# Patient Record
Sex: Female | Born: 1935
Health system: Southern US, Community
[De-identification: ages and names within clinical notes are randomized; demographics above are authoritative.]

## PROBLEM LIST (undated history)

## (undated) DIAGNOSIS — T7840XA Allergy, unspecified, initial encounter: Secondary | ICD-10-CM

## (undated) DIAGNOSIS — F32A Depression, unspecified: Secondary | ICD-10-CM

## (undated) DIAGNOSIS — F329 Major depressive disorder, single episode, unspecified: Secondary | ICD-10-CM

## (undated) DIAGNOSIS — M1990 Unspecified osteoarthritis, unspecified site: Secondary | ICD-10-CM

## (undated) DIAGNOSIS — C73 Malignant neoplasm of thyroid gland: Secondary | ICD-10-CM

## (undated) DIAGNOSIS — I10 Essential (primary) hypertension: Secondary | ICD-10-CM

## (undated) DIAGNOSIS — F419 Anxiety disorder, unspecified: Secondary | ICD-10-CM

## (undated) DIAGNOSIS — I482 Chronic atrial fibrillation, unspecified: Secondary | ICD-10-CM

## (undated) DIAGNOSIS — U071 COVID-19: Secondary | ICD-10-CM

## (undated) HISTORY — DX: Malignant neoplasm of thyroid gland: C73

## (undated) HISTORY — DX: Major depressive disorder, single episode, unspecified: F32.9

## (undated) HISTORY — PX: TONSILLECTOMY AND ADENOIDECTOMY: SUR1326

## (undated) HISTORY — DX: Allergy, unspecified, initial encounter: T78.40XA

## (undated) HISTORY — PX: THYROIDECTOMY: SHX17

## (undated) HISTORY — DX: Chronic atrial fibrillation, unspecified: I48.20

## (undated) HISTORY — DX: Unspecified osteoarthritis, unspecified site: M19.90

## (undated) HISTORY — DX: Depression, unspecified: F32.A

## (undated) HISTORY — DX: Essential (primary) hypertension: I10

## (undated) HISTORY — DX: Anxiety disorder, unspecified: F41.9

## (undated) HISTORY — PX: APPENDECTOMY: SHX54

---

## 2005-03-27 DIAGNOSIS — J301 Allergic rhinitis due to pollen: Secondary | ICD-10-CM | POA: Insufficient documentation

## 2008-12-21 DIAGNOSIS — M109 Gout, unspecified: Secondary | ICD-10-CM | POA: Insufficient documentation

## 2012-06-24 DIAGNOSIS — E559 Vitamin D deficiency, unspecified: Secondary | ICD-10-CM | POA: Insufficient documentation

## 2014-04-03 DIAGNOSIS — M810 Age-related osteoporosis without current pathological fracture: Secondary | ICD-10-CM | POA: Insufficient documentation

## 2014-08-29 DIAGNOSIS — I10 Essential (primary) hypertension: Secondary | ICD-10-CM | POA: Insufficient documentation

## 2015-02-27 DIAGNOSIS — E782 Mixed hyperlipidemia: Secondary | ICD-10-CM | POA: Insufficient documentation

## 2015-08-30 DIAGNOSIS — I480 Paroxysmal atrial fibrillation: Secondary | ICD-10-CM | POA: Insufficient documentation

## 2016-09-02 DIAGNOSIS — R911 Solitary pulmonary nodule: Secondary | ICD-10-CM | POA: Insufficient documentation

## 2016-12-31 DIAGNOSIS — J449 Chronic obstructive pulmonary disease, unspecified: Secondary | ICD-10-CM | POA: Diagnosis not present

## 2016-12-31 DIAGNOSIS — R918 Other nonspecific abnormal finding of lung field: Secondary | ICD-10-CM | POA: Diagnosis not present

## 2017-01-02 DIAGNOSIS — E039 Hypothyroidism, unspecified: Secondary | ICD-10-CM | POA: Diagnosis not present

## 2017-01-02 DIAGNOSIS — F411 Generalized anxiety disorder: Secondary | ICD-10-CM | POA: Diagnosis not present

## 2017-01-02 DIAGNOSIS — E785 Hyperlipidemia, unspecified: Secondary | ICD-10-CM | POA: Diagnosis not present

## 2017-01-02 DIAGNOSIS — J410 Simple chronic bronchitis: Secondary | ICD-10-CM | POA: Diagnosis not present

## 2017-01-02 DIAGNOSIS — I10 Essential (primary) hypertension: Secondary | ICD-10-CM | POA: Diagnosis not present

## 2017-01-08 DIAGNOSIS — T887XXA Unspecified adverse effect of drug or medicament, initial encounter: Secondary | ICD-10-CM | POA: Diagnosis not present

## 2017-01-08 DIAGNOSIS — I10 Essential (primary) hypertension: Secondary | ICD-10-CM | POA: Diagnosis not present

## 2017-02-04 DIAGNOSIS — J22 Unspecified acute lower respiratory infection: Secondary | ICD-10-CM | POA: Diagnosis not present

## 2017-03-09 DIAGNOSIS — R05 Cough: Secondary | ICD-10-CM | POA: Diagnosis not present

## 2017-03-09 DIAGNOSIS — I10 Essential (primary) hypertension: Secondary | ICD-10-CM | POA: Diagnosis not present

## 2017-03-09 DIAGNOSIS — J410 Simple chronic bronchitis: Secondary | ICD-10-CM | POA: Diagnosis not present

## 2017-03-09 DIAGNOSIS — F411 Generalized anxiety disorder: Secondary | ICD-10-CM | POA: Diagnosis not present

## 2017-03-09 DIAGNOSIS — N289 Disorder of kidney and ureter, unspecified: Secondary | ICD-10-CM | POA: Diagnosis not present

## 2017-03-09 DIAGNOSIS — E039 Hypothyroidism, unspecified: Secondary | ICD-10-CM | POA: Diagnosis not present

## 2017-03-09 DIAGNOSIS — R739 Hyperglycemia, unspecified: Secondary | ICD-10-CM | POA: Diagnosis not present

## 2017-03-16 DIAGNOSIS — R911 Solitary pulmonary nodule: Secondary | ICD-10-CM | POA: Diagnosis present

## 2017-03-16 DIAGNOSIS — Z7982 Long term (current) use of aspirin: Secondary | ICD-10-CM | POA: Diagnosis not present

## 2017-03-16 DIAGNOSIS — I340 Nonrheumatic mitral (valve) insufficiency: Secondary | ICD-10-CM | POA: Diagnosis not present

## 2017-03-16 DIAGNOSIS — J9811 Atelectasis: Secondary | ICD-10-CM | POA: Diagnosis not present

## 2017-03-16 DIAGNOSIS — I160 Hypertensive urgency: Secondary | ICD-10-CM | POA: Diagnosis not present

## 2017-03-16 DIAGNOSIS — E860 Dehydration: Secondary | ICD-10-CM | POA: Diagnosis not present

## 2017-03-16 DIAGNOSIS — R05 Cough: Secondary | ICD-10-CM | POA: Diagnosis not present

## 2017-03-16 DIAGNOSIS — E785 Hyperlipidemia, unspecified: Secondary | ICD-10-CM | POA: Diagnosis present

## 2017-03-16 DIAGNOSIS — D72829 Elevated white blood cell count, unspecified: Secondary | ICD-10-CM | POA: Diagnosis not present

## 2017-03-16 DIAGNOSIS — E890 Postprocedural hypothyroidism: Secondary | ICD-10-CM | POA: Diagnosis present

## 2017-03-16 DIAGNOSIS — I10 Essential (primary) hypertension: Secondary | ICD-10-CM | POA: Diagnosis not present

## 2017-03-16 DIAGNOSIS — E871 Hypo-osmolality and hyponatremia: Secondary | ICD-10-CM | POA: Diagnosis not present

## 2017-03-16 DIAGNOSIS — M810 Age-related osteoporosis without current pathological fracture: Secondary | ICD-10-CM | POA: Diagnosis present

## 2017-03-16 DIAGNOSIS — I480 Paroxysmal atrial fibrillation: Secondary | ICD-10-CM | POA: Diagnosis present

## 2017-03-16 DIAGNOSIS — I161 Hypertensive emergency: Secondary | ICD-10-CM | POA: Diagnosis not present

## 2017-03-16 DIAGNOSIS — J449 Chronic obstructive pulmonary disease, unspecified: Secondary | ICD-10-CM | POA: Diagnosis present

## 2017-03-16 DIAGNOSIS — I440 Atrioventricular block, first degree: Secondary | ICD-10-CM | POA: Diagnosis not present

## 2017-03-16 DIAGNOSIS — Z85850 Personal history of malignant neoplasm of thyroid: Secondary | ICD-10-CM | POA: Diagnosis not present

## 2017-03-16 DIAGNOSIS — I313 Pericardial effusion (noninflammatory): Secondary | ICD-10-CM | POA: Diagnosis not present

## 2017-03-16 DIAGNOSIS — E119 Type 2 diabetes mellitus without complications: Secondary | ICD-10-CM | POA: Diagnosis not present

## 2017-03-16 DIAGNOSIS — R55 Syncope and collapse: Secondary | ICD-10-CM | POA: Diagnosis not present

## 2017-03-16 DIAGNOSIS — M109 Gout, unspecified: Secondary | ICD-10-CM | POA: Diagnosis present

## 2017-03-23 DIAGNOSIS — I10 Essential (primary) hypertension: Secondary | ICD-10-CM | POA: Diagnosis not present

## 2017-03-23 DIAGNOSIS — J449 Chronic obstructive pulmonary disease, unspecified: Secondary | ICD-10-CM | POA: Diagnosis not present

## 2017-03-23 DIAGNOSIS — E785 Hyperlipidemia, unspecified: Secondary | ICD-10-CM | POA: Diagnosis not present

## 2017-03-23 DIAGNOSIS — I480 Paroxysmal atrial fibrillation: Secondary | ICD-10-CM | POA: Diagnosis not present

## 2017-03-23 DIAGNOSIS — I499 Cardiac arrhythmia, unspecified: Secondary | ICD-10-CM | POA: Diagnosis not present

## 2017-03-23 DIAGNOSIS — R55 Syncope and collapse: Secondary | ICD-10-CM | POA: Diagnosis not present

## 2017-03-23 DIAGNOSIS — Z79899 Other long term (current) drug therapy: Secondary | ICD-10-CM | POA: Diagnosis not present

## 2017-03-23 DIAGNOSIS — I440 Atrioventricular block, first degree: Secondary | ICD-10-CM | POA: Diagnosis not present

## 2017-03-27 DIAGNOSIS — E039 Hypothyroidism, unspecified: Secondary | ICD-10-CM | POA: Diagnosis not present

## 2017-03-27 DIAGNOSIS — I4891 Unspecified atrial fibrillation: Secondary | ICD-10-CM | POA: Diagnosis not present

## 2017-03-27 DIAGNOSIS — S32010A Wedge compression fracture of first lumbar vertebra, initial encounter for closed fracture: Secondary | ICD-10-CM | POA: Diagnosis not present

## 2017-03-27 DIAGNOSIS — I440 Atrioventricular block, first degree: Secondary | ICD-10-CM | POA: Diagnosis not present

## 2017-03-27 DIAGNOSIS — M4856XA Collapsed vertebra, not elsewhere classified, lumbar region, initial encounter for fracture: Secondary | ICD-10-CM | POA: Diagnosis not present

## 2017-03-27 DIAGNOSIS — E781 Pure hyperglyceridemia: Secondary | ICD-10-CM | POA: Diagnosis not present

## 2017-03-27 DIAGNOSIS — M545 Low back pain: Secondary | ICD-10-CM | POA: Diagnosis not present

## 2017-03-27 DIAGNOSIS — W19XXXA Unspecified fall, initial encounter: Secondary | ICD-10-CM | POA: Diagnosis not present

## 2017-03-27 DIAGNOSIS — I10 Essential (primary) hypertension: Secondary | ICD-10-CM | POA: Diagnosis not present

## 2017-03-27 DIAGNOSIS — E890 Postprocedural hypothyroidism: Secondary | ICD-10-CM | POA: Diagnosis not present

## 2017-03-27 DIAGNOSIS — M109 Gout, unspecified: Secondary | ICD-10-CM | POA: Diagnosis not present

## 2017-03-27 DIAGNOSIS — J449 Chronic obstructive pulmonary disease, unspecified: Secondary | ICD-10-CM | POA: Diagnosis not present

## 2017-03-27 DIAGNOSIS — E785 Hyperlipidemia, unspecified: Secondary | ICD-10-CM | POA: Diagnosis not present

## 2017-03-27 DIAGNOSIS — I480 Paroxysmal atrial fibrillation: Secondary | ICD-10-CM | POA: Diagnosis not present

## 2017-03-27 DIAGNOSIS — E119 Type 2 diabetes mellitus without complications: Secondary | ICD-10-CM | POA: Diagnosis not present

## 2017-03-27 DIAGNOSIS — R300 Dysuria: Secondary | ICD-10-CM | POA: Diagnosis not present

## 2017-03-31 DIAGNOSIS — R05 Cough: Secondary | ICD-10-CM | POA: Diagnosis not present

## 2017-03-31 DIAGNOSIS — I10 Essential (primary) hypertension: Secondary | ICD-10-CM | POA: Diagnosis not present

## 2017-03-31 DIAGNOSIS — J449 Chronic obstructive pulmonary disease, unspecified: Secondary | ICD-10-CM | POA: Diagnosis not present

## 2017-04-02 DIAGNOSIS — M4316 Spondylolisthesis, lumbar region: Secondary | ICD-10-CM | POA: Diagnosis not present

## 2017-04-02 DIAGNOSIS — M4856XA Collapsed vertebra, not elsewhere classified, lumbar region, initial encounter for fracture: Secondary | ICD-10-CM | POA: Diagnosis not present

## 2017-04-02 DIAGNOSIS — M5136 Other intervertebral disc degeneration, lumbar region: Secondary | ICD-10-CM | POA: Diagnosis not present

## 2017-04-02 DIAGNOSIS — M1288 Other specific arthropathies, not elsewhere classified, other specified site: Secondary | ICD-10-CM | POA: Diagnosis not present

## 2017-04-02 DIAGNOSIS — S32010A Wedge compression fracture of first lumbar vertebra, initial encounter for closed fracture: Secondary | ICD-10-CM | POA: Diagnosis not present

## 2017-04-02 DIAGNOSIS — M419 Scoliosis, unspecified: Secondary | ICD-10-CM | POA: Diagnosis not present

## 2017-04-09 DIAGNOSIS — M4695 Unspecified inflammatory spondylopathy, thoracolumbar region: Secondary | ICD-10-CM | POA: Diagnosis not present

## 2017-04-09 DIAGNOSIS — M4805 Spinal stenosis, thoracolumbar region: Secondary | ICD-10-CM | POA: Diagnosis not present

## 2017-04-09 DIAGNOSIS — W19XXXA Unspecified fall, initial encounter: Secondary | ICD-10-CM | POA: Diagnosis not present

## 2017-04-09 DIAGNOSIS — M2578 Osteophyte, vertebrae: Secondary | ICD-10-CM | POA: Diagnosis not present

## 2017-04-09 DIAGNOSIS — R600 Localized edema: Secondary | ICD-10-CM | POA: Diagnosis not present

## 2017-04-09 DIAGNOSIS — M48061 Spinal stenosis, lumbar region without neurogenic claudication: Secondary | ICD-10-CM | POA: Diagnosis not present

## 2017-04-09 DIAGNOSIS — S32010D Wedge compression fracture of first lumbar vertebra, subsequent encounter for fracture with routine healing: Secondary | ICD-10-CM | POA: Diagnosis not present

## 2017-04-09 DIAGNOSIS — S32010A Wedge compression fracture of first lumbar vertebra, initial encounter for closed fracture: Secondary | ICD-10-CM | POA: Diagnosis not present

## 2017-04-18 DIAGNOSIS — R55 Syncope and collapse: Secondary | ICD-10-CM | POA: Diagnosis not present

## 2017-04-21 DIAGNOSIS — E039 Hypothyroidism, unspecified: Secondary | ICD-10-CM | POA: Diagnosis not present

## 2017-04-21 DIAGNOSIS — R531 Weakness: Secondary | ICD-10-CM | POA: Diagnosis not present

## 2017-04-21 DIAGNOSIS — E1165 Type 2 diabetes mellitus with hyperglycemia: Secondary | ICD-10-CM | POA: Diagnosis not present

## 2017-04-21 DIAGNOSIS — I499 Cardiac arrhythmia, unspecified: Secondary | ICD-10-CM | POA: Diagnosis not present

## 2017-04-21 DIAGNOSIS — J449 Chronic obstructive pulmonary disease, unspecified: Secondary | ICD-10-CM | POA: Diagnosis not present

## 2017-04-21 DIAGNOSIS — I440 Atrioventricular block, first degree: Secondary | ICD-10-CM | POA: Diagnosis not present

## 2017-04-21 DIAGNOSIS — Z7982 Long term (current) use of aspirin: Secondary | ICD-10-CM | POA: Diagnosis not present

## 2017-04-21 DIAGNOSIS — I4891 Unspecified atrial fibrillation: Secondary | ICD-10-CM | POA: Diagnosis not present

## 2017-04-21 DIAGNOSIS — I10 Essential (primary) hypertension: Secondary | ICD-10-CM | POA: Diagnosis not present

## 2017-04-21 DIAGNOSIS — R5381 Other malaise: Secondary | ICD-10-CM | POA: Diagnosis not present

## 2017-04-30 DIAGNOSIS — I10 Essential (primary) hypertension: Secondary | ICD-10-CM | POA: Diagnosis not present

## 2017-04-30 DIAGNOSIS — F411 Generalized anxiety disorder: Secondary | ICD-10-CM | POA: Diagnosis not present

## 2017-04-30 DIAGNOSIS — E785 Hyperlipidemia, unspecified: Secondary | ICD-10-CM | POA: Diagnosis not present

## 2017-04-30 DIAGNOSIS — J449 Chronic obstructive pulmonary disease, unspecified: Secondary | ICD-10-CM | POA: Diagnosis not present

## 2017-05-12 DIAGNOSIS — I480 Paroxysmal atrial fibrillation: Secondary | ICD-10-CM | POA: Diagnosis not present

## 2017-05-14 DIAGNOSIS — E785 Hyperlipidemia, unspecified: Secondary | ICD-10-CM | POA: Diagnosis not present

## 2017-05-14 DIAGNOSIS — I480 Paroxysmal atrial fibrillation: Secondary | ICD-10-CM | POA: Diagnosis not present

## 2017-05-14 DIAGNOSIS — I10 Essential (primary) hypertension: Secondary | ICD-10-CM | POA: Diagnosis not present

## 2017-05-14 DIAGNOSIS — J449 Chronic obstructive pulmonary disease, unspecified: Secondary | ICD-10-CM | POA: Diagnosis not present

## 2017-05-20 DIAGNOSIS — Z79899 Other long term (current) drug therapy: Secondary | ICD-10-CM | POA: Diagnosis not present

## 2017-07-01 DIAGNOSIS — R918 Other nonspecific abnormal finding of lung field: Secondary | ICD-10-CM | POA: Diagnosis not present

## 2017-07-01 DIAGNOSIS — J449 Chronic obstructive pulmonary disease, unspecified: Secondary | ICD-10-CM | POA: Diagnosis not present

## 2017-07-16 DIAGNOSIS — I480 Paroxysmal atrial fibrillation: Secondary | ICD-10-CM | POA: Diagnosis not present

## 2017-08-17 DIAGNOSIS — J449 Chronic obstructive pulmonary disease, unspecified: Secondary | ICD-10-CM | POA: Diagnosis not present

## 2017-08-17 DIAGNOSIS — I480 Paroxysmal atrial fibrillation: Secondary | ICD-10-CM | POA: Diagnosis not present

## 2017-08-17 DIAGNOSIS — E785 Hyperlipidemia, unspecified: Secondary | ICD-10-CM | POA: Diagnosis not present

## 2017-08-17 DIAGNOSIS — E039 Hypothyroidism, unspecified: Secondary | ICD-10-CM | POA: Diagnosis not present

## 2017-08-17 DIAGNOSIS — Z79899 Other long term (current) drug therapy: Secondary | ICD-10-CM | POA: Diagnosis not present

## 2017-08-17 DIAGNOSIS — I10 Essential (primary) hypertension: Secondary | ICD-10-CM | POA: Diagnosis not present

## 2017-08-17 DIAGNOSIS — Z0000 Encounter for general adult medical examination without abnormal findings: Secondary | ICD-10-CM | POA: Diagnosis not present

## 2017-11-02 DIAGNOSIS — R070 Pain in throat: Secondary | ICD-10-CM | POA: Diagnosis not present

## 2017-12-03 DIAGNOSIS — Z23 Encounter for immunization: Secondary | ICD-10-CM | POA: Diagnosis not present

## 2018-02-18 DIAGNOSIS — E039 Hypothyroidism, unspecified: Secondary | ICD-10-CM | POA: Diagnosis not present

## 2018-02-18 DIAGNOSIS — I480 Paroxysmal atrial fibrillation: Secondary | ICD-10-CM | POA: Diagnosis not present

## 2018-02-18 DIAGNOSIS — I10 Essential (primary) hypertension: Secondary | ICD-10-CM | POA: Diagnosis not present

## 2018-02-18 DIAGNOSIS — F411 Generalized anxiety disorder: Secondary | ICD-10-CM | POA: Diagnosis not present

## 2018-02-18 LAB — BASIC METABOLIC PANEL
BUN: 11 (ref 4–21)
Creatinine: 1.2 — AB (ref <=1.1)
GLUCOSE: 90
Potassium: 4.5 (ref 3.4–5.3)
SODIUM: 135 — AB (ref 137–147)

## 2018-02-26 DIAGNOSIS — E119 Type 2 diabetes mellitus without complications: Secondary | ICD-10-CM | POA: Diagnosis not present

## 2018-02-26 DIAGNOSIS — Z7982 Long term (current) use of aspirin: Secondary | ICD-10-CM | POA: Diagnosis not present

## 2018-02-26 DIAGNOSIS — E039 Hypothyroidism, unspecified: Secondary | ICD-10-CM | POA: Diagnosis not present

## 2018-02-26 DIAGNOSIS — I4891 Unspecified atrial fibrillation: Secondary | ICD-10-CM | POA: Diagnosis not present

## 2018-02-26 DIAGNOSIS — J449 Chronic obstructive pulmonary disease, unspecified: Secondary | ICD-10-CM | POA: Diagnosis not present

## 2018-02-26 DIAGNOSIS — Z85850 Personal history of malignant neoplasm of thyroid: Secondary | ICD-10-CM | POA: Diagnosis not present

## 2018-02-26 DIAGNOSIS — R112 Nausea with vomiting, unspecified: Secondary | ICD-10-CM | POA: Diagnosis not present

## 2018-02-26 DIAGNOSIS — E785 Hyperlipidemia, unspecified: Secondary | ICD-10-CM | POA: Diagnosis not present

## 2018-02-26 DIAGNOSIS — I10 Essential (primary) hypertension: Secondary | ICD-10-CM | POA: Diagnosis not present

## 2018-02-26 DIAGNOSIS — R109 Unspecified abdominal pain: Secondary | ICD-10-CM | POA: Diagnosis not present

## 2018-02-26 DIAGNOSIS — R197 Diarrhea, unspecified: Secondary | ICD-10-CM | POA: Diagnosis not present

## 2018-03-29 DIAGNOSIS — H348321 Tributary (branch) retinal vein occlusion, left eye, with retinal neovascularization: Secondary | ICD-10-CM | POA: Diagnosis not present

## 2018-03-29 DIAGNOSIS — H2511 Age-related nuclear cataract, right eye: Secondary | ICD-10-CM | POA: Diagnosis not present

## 2018-03-29 DIAGNOSIS — H2513 Age-related nuclear cataract, bilateral: Secondary | ICD-10-CM | POA: Diagnosis not present

## 2018-04-08 DIAGNOSIS — I440 Atrioventricular block, first degree: Secondary | ICD-10-CM | POA: Diagnosis not present

## 2018-04-08 DIAGNOSIS — I480 Paroxysmal atrial fibrillation: Secondary | ICD-10-CM | POA: Diagnosis not present

## 2018-04-29 DIAGNOSIS — H2511 Age-related nuclear cataract, right eye: Secondary | ICD-10-CM | POA: Diagnosis not present

## 2018-04-29 DIAGNOSIS — E039 Hypothyroidism, unspecified: Secondary | ICD-10-CM | POA: Diagnosis not present

## 2018-04-29 DIAGNOSIS — I10 Essential (primary) hypertension: Secondary | ICD-10-CM | POA: Diagnosis not present

## 2018-04-29 DIAGNOSIS — M1990 Unspecified osteoarthritis, unspecified site: Secondary | ICD-10-CM | POA: Diagnosis not present

## 2018-04-30 DIAGNOSIS — H348322 Tributary (branch) retinal vein occlusion, left eye, stable: Secondary | ICD-10-CM | POA: Diagnosis not present

## 2018-04-30 DIAGNOSIS — Z961 Presence of intraocular lens: Secondary | ICD-10-CM | POA: Diagnosis not present

## 2018-05-18 DIAGNOSIS — H34832 Tributary (branch) retinal vein occlusion, left eye, with macular edema: Secondary | ICD-10-CM | POA: Diagnosis not present

## 2018-05-18 DIAGNOSIS — H348321 Tributary (branch) retinal vein occlusion, left eye, with retinal neovascularization: Secondary | ICD-10-CM | POA: Diagnosis not present

## 2018-06-15 DIAGNOSIS — H34832 Tributary (branch) retinal vein occlusion, left eye, with macular edema: Secondary | ICD-10-CM | POA: Diagnosis not present

## 2018-06-15 DIAGNOSIS — Z961 Presence of intraocular lens: Secondary | ICD-10-CM | POA: Diagnosis not present

## 2018-06-15 DIAGNOSIS — H2511 Age-related nuclear cataract, right eye: Secondary | ICD-10-CM | POA: Diagnosis not present

## 2018-06-15 DIAGNOSIS — H35352 Cystoid macular degeneration, left eye: Secondary | ICD-10-CM | POA: Diagnosis not present

## 2018-06-15 DIAGNOSIS — Z9841 Cataract extraction status, right eye: Secondary | ICD-10-CM | POA: Diagnosis not present

## 2018-06-15 DIAGNOSIS — H348321 Tributary (branch) retinal vein occlusion, left eye, with retinal neovascularization: Secondary | ICD-10-CM | POA: Diagnosis not present

## 2018-07-13 DIAGNOSIS — H2511 Age-related nuclear cataract, right eye: Secondary | ICD-10-CM | POA: Diagnosis not present

## 2018-07-13 DIAGNOSIS — Z9841 Cataract extraction status, right eye: Secondary | ICD-10-CM | POA: Diagnosis not present

## 2018-07-13 DIAGNOSIS — H348321 Tributary (branch) retinal vein occlusion, left eye, with retinal neovascularization: Secondary | ICD-10-CM | POA: Diagnosis not present

## 2018-07-13 DIAGNOSIS — H34832 Tributary (branch) retinal vein occlusion, left eye, with macular edema: Secondary | ICD-10-CM | POA: Diagnosis not present

## 2018-07-13 DIAGNOSIS — H35352 Cystoid macular degeneration, left eye: Secondary | ICD-10-CM | POA: Diagnosis not present

## 2018-07-13 DIAGNOSIS — Z961 Presence of intraocular lens: Secondary | ICD-10-CM | POA: Diagnosis not present

## 2018-08-10 DIAGNOSIS — Z961 Presence of intraocular lens: Secondary | ICD-10-CM | POA: Diagnosis not present

## 2018-08-10 DIAGNOSIS — H34832 Tributary (branch) retinal vein occlusion, left eye, with macular edema: Secondary | ICD-10-CM | POA: Diagnosis not present

## 2018-08-10 DIAGNOSIS — Z9841 Cataract extraction status, right eye: Secondary | ICD-10-CM | POA: Diagnosis not present

## 2018-08-19 ENCOUNTER — Ambulatory Visit (INDEPENDENT_AMBULATORY_CARE_PROVIDER_SITE_OTHER): Payer: Medicare Other | Admitting: Family Medicine

## 2018-08-19 ENCOUNTER — Encounter: Payer: Self-pay | Admitting: Family Medicine

## 2018-08-19 ENCOUNTER — Other Ambulatory Visit: Payer: Self-pay

## 2018-08-19 VITALS — BP 140/82 | HR 70 | Temp 97.8°F | Ht 68.0 in | Wt 148.6 lb

## 2018-08-19 DIAGNOSIS — I482 Chronic atrial fibrillation, unspecified: Secondary | ICD-10-CM

## 2018-08-19 DIAGNOSIS — I10 Essential (primary) hypertension: Secondary | ICD-10-CM

## 2018-08-19 DIAGNOSIS — R42 Dizziness and giddiness: Secondary | ICD-10-CM | POA: Diagnosis not present

## 2018-08-19 DIAGNOSIS — Z85850 Personal history of malignant neoplasm of thyroid: Secondary | ICD-10-CM | POA: Diagnosis not present

## 2018-08-19 DIAGNOSIS — F419 Anxiety disorder, unspecified: Secondary | ICD-10-CM | POA: Diagnosis not present

## 2018-08-19 DIAGNOSIS — E890 Postprocedural hypothyroidism: Secondary | ICD-10-CM | POA: Diagnosis not present

## 2018-08-19 HISTORY — DX: Chronic atrial fibrillation, unspecified: I48.20

## 2018-08-19 HISTORY — DX: Anxiety disorder, unspecified: F41.9

## 2018-08-19 HISTORY — DX: Essential (primary) hypertension: I10

## 2018-08-19 NOTE — Patient Instructions (Addendum)
Please return in 3 months for for follow up of your hypertension. I'd like to repeat your blood work at that time. Please come fasting if possible.   Please sign a release of information for your cardiology and pcp records.   It was a pleasure meeting you today! Thank you for choosing [place] to meet your healthcare needs! I truly look forward to working with you. If you have any questions or concerns, please send me a message via Mychart or call the office at [phone].  We will call you with information regarding your referral appointment. Cardiology. I'd like their assistance in managing your blood pressures. If you do not hear from [place] within the next 2 weeks, please let me know. It can take 1-2 weeks to get appointments set up with the specialists.

## 2018-08-19 NOTE — Progress Notes (Signed)
Subjective  CC:  Chief Complaint  Patient presents with  . Establish Care    Relocated here from [facility], [place]. about a month ago. Saw Dr. Kara Dies, last physical was in Febuary 2019  . Hypertension  . Dizziness    HPI: Tammy Harding is a 84 y.o. female who presents to [place] at [facility] today to establish care with me as a new patient.  Very pleasant 84 yo female moved to [place] to be near her son and his family. Widowed in Feb 2019 after 64 year marriage.  Reports doing well with the transition She has the following concerns or needs:  History of what sounds like chronic atrial fibrillation on amiodarone.  She reports this is been well controlled.  She takes 2 aspirin daily.  Reportedly was on Coumadin before but this could not be regulated.  No records were reviewed at this time  History of malignant hypertension requiring ICU hospitalization last year.  Reports multiple allergies to medications and intolerances.  Currently on 3 medications for blood pressure.  These were changed about 3 or 4 months ago.  Since, she experiences lightheadedness upon standing in the morning.  Blood pressure readings in the morning tend to be low, 120s over 70s.  In the evenings they are 140s over 80s.  She denies palpitations, chest pains, dyspnea on exertion, lower extremity edema or syncope.  At times she will cut her carvedilol in half.  Her cardiologist was managing her blood pressure.  I do not have those records currently.  She denies history of hyperlipidemia or renal dysfunction.  Chronic anxiety which has been well controlled with Paxil for decades.  Uses intermittent Xanax but rarely.  Denies symptoms of inappropriate depression.  She is appropriately grieving at this time.  Getting along well, however her motivation is low.  History of hypothyroidism status post thyroidectomy due to history of cancer.  Reports levels have been well controlled, most recently checked in  May.  She takes her medication daily on an empty stomach.  Assessment  1. Malignant hypertension   2. Essential hypertension   3. Chronic atrial fibrillation ([place])   4. Postoperative hypothyroidism   5. History of thyroid cancer   6. Lightheadedness   7. Chronic anxiety      Plan   Malignant hypertension with multiple medications with possible side effects of orthostatic hypotension.  Need to get old records for my review.  Refer to cardiology for further assistance.  Fall risk avoidance discussed she is not orthostatic here in the office today.  Heart rate is well controlled  A. fib, normal sinus rhythm today with normal heart rhythm, rate.  Continue amiodarone and follow-up with cardiology.  Need old records  Hypothyroidism reportedly well controlled.  Clinically euthyroid.  Recheck in 3 months with lab work.  Continue current supplements  Chronic anxiety and allergies, continue current medications.  Follow up:  Return in about 3 months (around 11/18/2018) for follow up Hypertension and blood work. Orders Placed This Encounter  Procedures  . Ambulatory referral to Cardiology   No orders of the defined types were placed in this encounter.    Depression screen PHQ 2/9 08/19/2018  Decreased Interest 0  Down, Depressed, Hopeless 0  PHQ - 2 Score 0    We updated and reviewed the patient's past history in detail and it is documented below.  Patient Active Problem List   Diagnosis Date Noted  . Postoperative hypothyroidism 08/19/2018  . History  of thyroid cancer 08/19/2018  . Chronic anxiety 08/19/2018  . Lung nodule 09/02/2016  . Paroxysmal atrial fibrillation ([place]) 08/30/2015    controlled witht paceron in sinus   . Mixed hyperlipidemia 02/27/2015  . Essential hypertension 08/29/2014  . Osteoporosis 04/03/2014    cannot tolerate fosamax IMO Load 2016 R1.3   . Vitamin D deficiency 06/24/2012    IMO Load 2016 R1.3   . Insomnia 08/13/2010  . IBS (irritable bowel  syndrome) 08/07/2009  . COPD (chronic obstructive pulmonary disease) ([place]) 05/22/2009    Moderate reversible defect on pft April 2010   . Gout 12/21/2008    ICD10 Conversion   . Allergic rhinitis due to pollen 03/27/2005  . Esophageal reflux 05/03/2004  . Generalized anxiety disorder 04/23/1999   Health Maintenance  Topic Date Due  . PNA vac Low Risk Adult (2 of 2 - PCV13) 09/24/2010  . DEXA SCAN  12/01/2014  . INFLUENZA VACCINE  07/01/2018  . TETANUS/TDAP  Discontinued   Immunization History  Administered Date(s) Administered  . Influenza Whole 11/09/2008, 09/13/2009, 10/15/2010  . Influenza, High Dose Seasonal PF 09/16/2011, 09/30/2012, 10/31/2013, 10/31/2014, 10/17/2015, 09/01/2016  . Influenza,inj,Quad PF,6+ Mos 12/03/2017  . Influenza-Unspecified 10/11/1999, 12/09/2000, 09/23/2002, 09/12/2003, 10/18/2004, 11/07/2005, 10/15/2006, 11/18/2007  . Pneumococcal-Unspecified 10/10/2003, 09/24/2009   Current Meds  Medication Sig  . ALPRAZolam (XANAX) 0.5 MG tablet   . amiodarone (PACERONE) 200 MG tablet   . aspirin 81 MG chewable tablet Chew by mouth daily.  . carvedilol (COREG) 12.5 MG tablet Take 12.5 mg by mouth 2 (two) times daily.  . cloNIDine (CATAPRES) 0.1 MG tablet   . hydrALAZINE (APRESOLINE) 25 MG tablet   . levothyroxine (SYNTHROID, LEVOTHROID) 150 MCG tablet Take 150 mcg by mouth daily.  Marland Kitchen PARoxetine (PAXIL) 10 MG tablet Take 10 mg by mouth daily.    Allergies: Patient is allergic to amlodipine; bee venom; hctz [hydrochlorothiazide]; hydralazine; lisinopril; and losartan. Past Medical History Patient  has a past medical history of Allergy, Anxiety, Arthritis, Chronic anxiety (08/19/2018), Chronic atrial fibrillation ([place]) (08/19/2018), Depression, Essential hypertension (08/19/2018), and Thyroid cancer ([place]). Past Surgical History Patient  has a past surgical history that includes Appendectomy; Thyroidectomy; and Tonsillectomy and adenoidectomy. Family  History: Patient family history includes Healthy in her son. Social History:  Patient  reports that she has never smoked. She has never used smokeless tobacco. She reports that she does not drink alcohol or use drugs.  Review of Systems: Constitutional: negative for fever or malaise Ophthalmic: negative for photophobia, double vision or loss of vision Cardiovascular: negative for chest pain, dyspnea on exertion, or new LE swelling Respiratory: negative for SOB or persistent cough Gastrointestinal: negative for abdominal pain, change in bowel habits or melena Genitourinary: negative for dysuria or gross hematuria Musculoskeletal: negative for new gait disturbance or muscular weakness Integumentary: negative for new or persistent rashes Neurological: negative for TIA or stroke symptoms Psychiatric: negative for SI or delusions Allergic/Immunologic: negative for hives  Patient Care Team    Relationship Specialty Notifications Start End  Leamon Arnt, MD PCP - General Family Medicine  08/19/18     Objective  Vitals: BP 140/82   Pulse 70   Temp 97.8 F (36.6 C)   Ht 5\' 8"  (1.727 m)   Wt 148 lb 9.6 oz (67.4 kg)   SpO2 96%   BMI 22.59 kg/m  General:  Well developed, well nourished, no acute distress  Psych:  Alert and oriented,normal mood and affect HEENT:  Normocephalic, atraumatic, non-icteric sclera, PERRL,  oropharynx is without mass or exudate, supple neck without adenopathy, mass or thyromegaly Cardiovascular:  RRR without gallop, rub or murmur, nondisplaced PMI Respiratory:  Good breath sounds bilaterally, CTAB with normal respiratory effort Gastrointestinal: normal bowel sounds, soft, non-tender, no noted masses. No HSM MSK: no deformities, contusions. Joints are without erythema or swelling, no peripheral edema Skin:  Warm, no rashes or suspicious lesions noted Neurologic:    Mental status is normal. Gross motor and sensory exams are normal. Normal gait, no  tremor   Commons side effects, risks, benefits, and alternatives for medications and treatment plan prescribed today were discussed, and the patient expressed understanding of the given instructions. Patient is instructed to call or message via MyChart if he/she has any questions or concerns regarding our treatment plan. No barriers to understanding were identified. We discussed Red Flag symptoms and signs in detail. Patient expressed understanding regarding what to do in case of urgent or emergency type symptoms.   Medication list was reconciled, printed and provided to the patient in AVS. Patient instructions and summary information was reviewed with the patient as documented in the AVS. This note was prepared with assistance of Dragon voice recognition software. Occasional wrong-word or sound-a-like substitutions may have occurred due to the inherent limitations of voice recognition software

## 2018-08-30 ENCOUNTER — Other Ambulatory Visit: Payer: Self-pay | Admitting: Family Medicine

## 2018-08-30 NOTE — Telephone Encounter (Signed)
Copied from [place] [id number]. Topic: Quick Communication - See Telephone Encounter >> Aug 30, 2018 10:25 AM Hewitt Shorts wrote: Pt is needing a refill on her alprazolam   [facility]  Best number >> Aug 30, 2018  2:54 PM Hewitt Shorts wrote: [phone]

## 2018-08-30 NOTE — Telephone Encounter (Signed)
Rx refill request: Alprazolam 0.5 mg      Last filled: 08/19/18  Historical provider  LOV: 08/19/18  PCP: [place]: verified

## 2018-08-31 NOTE — Telephone Encounter (Signed)
Will defer refill to PCP as this has never been prescribed for her, nor is the historical signature in the chart.

## 2018-09-03 ENCOUNTER — Encounter: Payer: Self-pay | Admitting: Emergency Medicine

## 2018-09-03 NOTE — Telephone Encounter (Signed)
Patient is calling to check on the status of refill from [place] on alprazolam. [facility]. Takes this medication for nervousness and anxiety. Please call patient once called in.

## 2018-09-06 MED ORDER — ALPRAZOLAM 0.5 MG PO TABS: 0.5000 mg | ORAL_TABLET | Freq: Every evening | ORAL | 0 refills | Status: DC | PRN

## 2018-09-06 NOTE — Telephone Encounter (Signed)
Spoke with Patient and informed her of RX called in. I apologized for the delay. Patient verbalized understanding

## 2018-09-06 NOTE — Telephone Encounter (Signed)
Please notify patient that med was refilled.  I was out of the office, please apologize for the delay.

## 2018-09-21 ENCOUNTER — Encounter: Payer: Self-pay | Admitting: Cardiovascular Disease

## 2018-09-21 ENCOUNTER — Ambulatory Visit (INDEPENDENT_AMBULATORY_CARE_PROVIDER_SITE_OTHER): Payer: Medicare Other | Admitting: Cardiovascular Disease

## 2018-09-21 DIAGNOSIS — I10 Essential (primary) hypertension: Secondary | ICD-10-CM | POA: Diagnosis not present

## 2018-09-21 DIAGNOSIS — E782 Mixed hyperlipidemia: Secondary | ICD-10-CM | POA: Diagnosis not present

## 2018-09-21 DIAGNOSIS — I480 Paroxysmal atrial fibrillation: Secondary | ICD-10-CM | POA: Diagnosis not present

## 2018-09-21 NOTE — Assessment & Plan Note (Signed)
History of PAF remotely on Pacerone currently in sinus rhythm.  She is not on oral anticoagulation.

## 2018-09-21 NOTE — Patient Instructions (Signed)
Medication Instructions:  Your physician recommends that you continue on your current medications as directed. Please refer to the Current Medication list given to you today.  If you need a refill on your cardiac medications before your next appointment, please call your pharmacy.   Lab work: none If you have labs (blood work) drawn today and your tests are completely normal, you will receive your results only by: . MyChart Message (if you have MyChart) OR . A paper copy in the mail If you have any lab test that is abnormal or we need to change your treatment, we will call you to review the results.  Testing/Procedures: none  Follow-Up: At [facility], you and your health needs are our priority.  As part of our continuing mission to provide you with exceptional heart care, we have created designated Provider Care Teams.  These Care Teams include your primary Cardiologist (physician) and Advanced Practice Providers (APPs -  Physician Assistants and Nurse Practitioners) who all work together to provide you with the care you need, when you need it. You will need a follow up appointment in 12 months.  Please call our office 2 months in advance to schedule this appointment.  You may see Dr. Berry or one of the following Advanced Practice Providers on your designated Care Team:   Luke Kilroy, PA-C Krista Kroeger, PA-C . Callie Goodrich, PA-C  Any Other Special Instructions Will Be Listed Below (If Applicable).    

## 2018-09-21 NOTE — Progress Notes (Signed)
09/21/2018 Tammy Harding   1932/01/28  [id number]  Primary Physician Leamon Arnt, MD Primary Cardiologist: Lorretta Harp MD FACP, [place], [place], [place]  HPI:  Tammy Harding is a 84 y.o. moderately overweight recently widowed (husband of 90 years died in 02/25/18) mother of 22, grandmother of 2 grandchildren who recently relocated from [place] down to [place] to be closer to her son.  She was referred by Dr. Billey Chang to be established because of history of PAF and hypertension.  Her risk factors only include hypertension.  She is never smoked nor she diabetic.  She is never had a heart attack or stroke.  There is no family history of heart disease.  She had a remote history of PAF on amiodarone but is not on oral anticoagulation.  Her blood pressure is well controlled on her current medications.  She has a remote history of thyroid cancer back in 1979.   Current Meds  Medication Sig  . ALPRAZolam (XANAX) 0.5 MG tablet Take 1 tablet (0.5 mg total) by mouth at bedtime as needed for anxiety.  Marland Kitchen amiodarone (PACERONE) 200 MG tablet Take 100 mg by mouth daily.   Marland Kitchen aspirin 81 MG chewable tablet Chew 162 mg by mouth daily.   . carvedilol (COREG) 12.5 MG tablet Take 12.5 mg by mouth 2 (two) times daily.  . cloNIDine (CATAPRES) 0.1 MG tablet Take 0.1 mg by mouth 3 (three) times daily.   . hydrALAZINE (APRESOLINE) 25 MG tablet Take 25 mg by mouth 3 (three) times daily.   Marland Kitchen levothyroxine (SYNTHROID, LEVOTHROID) 150 MCG tablet Take 150 mcg by mouth daily.  Marland Kitchen PARoxetine (PAXIL) 10 MG tablet Take 10 mg by mouth daily.     Allergies  Allergen Reactions  . Penicillins Swelling  . Amlodipine Swelling  . Bee Venom   . Hctz [Hydrochlorothiazide] Swelling  . Horse Epithelium   . Hydralazine     Postural hypotension   . Lisinopril   . Losartan     Social History   Socioeconomic History  . Marital status: Widowed    Spouse name: Not on file  . Number of children: 1    . Years of education: Not on file  . Highest education level: Not on file  Occupational History  . Not on file  Social Needs  . Financial resource strain: Not on file  . Food insecurity:    Worry: Not on file    Inability: Not on file  . Transportation needs:    Medical: Not on file    Non-medical: Not on file  Tobacco Use  . Smoking status: Never Smoker  . Smokeless tobacco: Never Used  Substance and Sexual Activity  . Alcohol use: Never    Frequency: Never  . Drug use: Never  . Sexual activity: Not Currently  Lifestyle  . Physical activity:    Days per week: Not on file    Minutes per session: Not on file  . Stress: Not on file  Relationships  . Social connections:    Talks on phone: Not on file    Gets together: Not on file    Attends religious service: Not on file    Active member of club or organization: Not on file    Attends meetings of clubs or organizations: Not on file    Relationship status: Not on file  . Intimate partner violence:    Fear of current or ex partner: Not on file  Emotionally abused: Not on file    Physically abused: Not on file    Forced sexual activity: Not on file  Other Topics Concern  . Not on file  Social History Narrative  . Not on file     Review of Systems: General: negative for chills, fever, night sweats or weight changes.  Cardiovascular: negative for chest pain, dyspnea on exertion, edema, orthopnea, palpitations, paroxysmal nocturnal dyspnea or shortness of breath Dermatological: negative for rash Respiratory: negative for cough or wheezing Urologic: negative for hematuria Abdominal: negative for nausea, vomiting, diarrhea, bright red blood per rectum, melena, or hematemesis Neurologic: negative for visual changes, syncope, or dizziness All other systems reviewed and are otherwise negative except as noted above.    Blood pressure 132/86, pulse 71, height 5\' 8"  (1.727 m), weight 158 lb (71.7 kg).  General appearance:  alert and no distress Neck: no adenopathy, no carotid bruit, no JVD, supple, symmetrical, trachea midline and thyroid not enlarged, symmetric, no tenderness/mass/nodules Lungs: clear to auscultation bilaterally Heart: regular rate and rhythm, S1, S2 normal, no murmur, click, rub or gallop Extremities: extremities normal, atraumatic, no cyanosis or edema Pulses: 2+ and symmetric Skin: Skin color, texture, turgor normal. No rashes or lesions Neurologic: Alert and oriented X 3, normal strength and tone. Normal symmetric reflexes. Normal coordination and gait  EKG sinus rhythm at 71 without ST or T wave changes.  I personally reviewed this EKG  ASSESSMENT AND PLAN:   Paroxysmal atrial fibrillation (H[facility]) History of PAF remotely on Pacerone currently in sinus rhythm.  She is not on oral anticoagulation.  Essential hypertension History of essential hypertension with blood pressure measured at 132/86.  She is on carvedilol, clonidine, and hydralazine.  Mixed hyperlipidemia History of mixed lipidemia not on statin therapy.      Lorretta Harp MD FACP,FACC,FAHA, [facility] 09/21/2018 11:23 AM

## 2018-09-21 NOTE — Assessment & Plan Note (Signed)
History of essential hypertension with blood pressure measured at 132/86.  She is on carvedilol, clonidine, and hydralazine.

## 2018-09-21 NOTE — Assessment & Plan Note (Signed)
History of mixed lipidemia not on statin therapy.

## 2018-09-27 DIAGNOSIS — Z9841 Cataract extraction status, right eye: Secondary | ICD-10-CM | POA: Diagnosis not present

## 2018-09-27 DIAGNOSIS — H348321 Tributary (branch) retinal vein occlusion, left eye, with retinal neovascularization: Secondary | ICD-10-CM | POA: Diagnosis not present

## 2018-09-27 DIAGNOSIS — H2511 Age-related nuclear cataract, right eye: Secondary | ICD-10-CM | POA: Diagnosis not present

## 2018-09-27 DIAGNOSIS — Z961 Presence of intraocular lens: Secondary | ICD-10-CM | POA: Diagnosis not present

## 2018-09-27 DIAGNOSIS — H35352 Cystoid macular degeneration, left eye: Secondary | ICD-10-CM | POA: Diagnosis not present

## 2018-09-27 DIAGNOSIS — H34832 Tributary (branch) retinal vein occlusion, left eye, with macular edema: Secondary | ICD-10-CM | POA: Diagnosis not present

## 2018-10-05 ENCOUNTER — Other Ambulatory Visit: Payer: Self-pay | Admitting: Family Medicine

## 2018-10-05 NOTE — Telephone Encounter (Signed)
Last OV: 08/19/2018 Last Fill: 09/06/2018  #30 given with No RF  ** 0.5mg  is on Back order. I Pended  0.25mg  tablets**

## 2018-10-05 NOTE — Telephone Encounter (Signed)
Pt had 30 pills filled 10/7; this is for prn use. Please call to make an appt to discuss use of this medication. I have refilled # 10 pills in the interim only. Thanks.

## 2018-10-06 NOTE — Telephone Encounter (Signed)
Patient states that she takes Xanax every night before bedtime, she states she gets anxious. I have made her an appt. For 10/12/2018 at 3:00pm to discuss management of Xanax

## 2018-10-12 ENCOUNTER — Encounter: Payer: Self-pay | Admitting: Family Medicine

## 2018-10-12 ENCOUNTER — Other Ambulatory Visit: Payer: Self-pay

## 2018-10-12 ENCOUNTER — Ambulatory Visit (INDEPENDENT_AMBULATORY_CARE_PROVIDER_SITE_OTHER): Payer: Medicare Other | Admitting: Family Medicine

## 2018-10-12 VITALS — BP 136/80 | HR 59 | Temp 97.6°F | Ht 68.0 in | Wt 158.8 lb

## 2018-10-12 DIAGNOSIS — F5101 Primary insomnia: Secondary | ICD-10-CM | POA: Diagnosis not present

## 2018-10-12 DIAGNOSIS — F419 Anxiety disorder, unspecified: Secondary | ICD-10-CM

## 2018-10-12 DIAGNOSIS — Z79899 Other long term (current) drug therapy: Secondary | ICD-10-CM | POA: Diagnosis not present

## 2018-10-12 DIAGNOSIS — Z23 Encounter for immunization: Secondary | ICD-10-CM | POA: Diagnosis not present

## 2018-10-12 MED ORDER — ALPRAZOLAM 0.25 MG PO TABS: 0.2500 mg | ORAL_TABLET | Freq: Every evening | ORAL | 1 refills | Status: DC | PRN

## 2018-10-12 NOTE — Patient Instructions (Signed)
Please follow up if symptoms do not improve or as needed.   You may use the xanax once nightly.

## 2018-10-12 NOTE — Progress Notes (Signed)
Subjective  CC:  Chief Complaint  Patient presents with  . Anxiety    discuss xanax, she states that she take 1/2 tablet every night    HPI: Tammy Harding is a 84 y.o. female who presents to the office today to address the problems listed above in the chief complaint.  Here to review use of benzo's. Last visit, reported on paxil longterm and prn xanax for sleep. However, she now reports she has been on benzo's for the last 30 years. Had used them as needed for anxiety. Now using 1/2 tab to help her sleep. Reports chronic anxiety and insomnia, worse this year to death of husband in 02-21-23. Mood is good. no AEs.   Due flu shot  Assessment  1. Chronic anxiety   2. Primary insomnia   3. Long term prescription benzodiazepine use   4. Encounter for immunization      Plan   Anxiety/insomnia on long term benzo's:  Would be hard to wean off meds. Educated on risks vs benefits. Will try to use low dose. May take 1/2 - 1 tab nightly. Wean as tolerated. meds refilled.   HD flu shot today  Follow up: prn   Orders Placed This Encounter  Procedures  . Flu vaccine HIGH DOSE PF   Meds ordered this encounter  Medications  . ALPRAZolam (XANAX) 0.25 MG tablet    Sig: Take 1 tablet (0.25 mg total) by mouth at bedtime as needed for anxiety.    Dispense:  90 tablet    Refill:  1    Not to exceed 5 additional fills before 03/05/2019      I reviewed the patients updated PMH, FH, and SocHx.    Patient Active Problem List   Diagnosis Date Noted  . Long term prescription benzodiazepine use 10/12/2018  . Postoperative hypothyroidism 08/19/2018  . History of thyroid cancer 08/19/2018  . Chronic anxiety 08/19/2018  . Lung nodule 09/02/2016  . Paroxysmal atrial fibrillation ([place]) 08/30/2015  . Mixed hyperlipidemia 02/27/2015  . Essential hypertension 08/29/2014  . Osteoporosis 04/03/2014  . Vitamin D deficiency 06/24/2012  . Insomnia 08/13/2010  . IBS (irritable bowel syndrome)  08/07/2009  . COPD (chronic obstructive pulmonary disease) ([place]) 05/22/2009  . Gout 12/21/2008  . Allergic rhinitis due to pollen 03/27/2005  . Esophageal reflux 05/03/2004  . Generalized anxiety disorder 04/23/1999   Current Meds  Medication Sig  . ALPRAZolam (XANAX) 0.25 MG tablet Take 1 tablet (0.25 mg total) by mouth at bedtime as needed for anxiety.  Marland Kitchen amiodarone (PACERONE) 200 MG tablet Take 100 mg by mouth daily.   Marland Kitchen aspirin 81 MG chewable tablet Chew 162 mg by mouth daily.   . carvedilol (COREG) 12.5 MG tablet Take 12.5 mg by mouth 2 (two) times daily.  . cloNIDine (CATAPRES) 0.1 MG tablet Take 0.1 mg by mouth 3 (three) times daily.   . hydrALAZINE (APRESOLINE) 25 MG tablet Take 25 mg by mouth 3 (three) times daily.   Marland Kitchen levothyroxine (SYNTHROID, LEVOTHROID) 150 MCG tablet Take 150 mcg by mouth daily.  Marland Kitchen PARoxetine (PAXIL) 10 MG tablet Take 10 mg by mouth daily.  . [DISCONTINUED] ALPRAZolam (XANAX) 0.25 MG tablet Take 1-2 tablets (0.25-0.5 mg total) by mouth at bedtime as needed for anxiety.    Allergies: Patient is allergic to penicillins; amlodipine; bee venom; hctz [hydrochlorothiazide]; horse epithelium; hydralazine; lisinopril; and losartan. Family History: Patient family history includes Arthritis in her father and mother; Depression in her sister; Healthy in her son; Heart  attack in her father; Hypertension in her mother, sister, and sister; Kidney disease in her mother. Social History:  Patient  reports that she has never smoked. She has never used smokeless tobacco. She reports that she does not drink alcohol or use drugs.  Review of Systems: Constitutional: Negative for fever malaise or anorexia Cardiovascular: negative for chest pain Respiratory: negative for SOB or persistent cough Gastrointestinal: negative for abdominal pain  Objective  Vitals: BP 136/80   Pulse (!) 59   Temp 97.6 F (36.4 C)   Ht 5\' 8"  (1.727 m)   Wt 158 lb 12.8 oz (72 kg)   SpO2 98%    BMI 24.15 kg/m  General: no acute distress , A&Ox3    Commons side effects, risks, benefits, and alternatives for medications and treatment plan prescribed today were discussed, and the patient expressed understanding of the given instructions. Patient is instructed to call or message via MyChart if he/she has any questions or concerns regarding our treatment plan. No barriers to understanding were identified. We discussed Red Flag symptoms and signs in detail. Patient expressed understanding regarding what to do in case of urgent or emergency type symptoms.   Medication list was reconciled, printed and provided to the patient in AVS. Patient instructions and summary information was reviewed with the patient as documented in the AVS. This note was prepared with assistance of Dragon voice recognition software. Occasional wrong-word or sound-a-like substitutions may have occurred due to the inherent limitations of voice recognition software

## 2018-10-17 ENCOUNTER — Other Ambulatory Visit: Payer: Self-pay | Admitting: Family Medicine

## 2018-11-18 ENCOUNTER — Ambulatory Visit: Payer: Medicare Other | Admitting: Family Medicine

## 2018-11-29 ENCOUNTER — Encounter: Payer: Self-pay | Admitting: Family Medicine

## 2018-11-29 ENCOUNTER — Ambulatory Visit (INDEPENDENT_AMBULATORY_CARE_PROVIDER_SITE_OTHER): Payer: Medicare Other | Admitting: Family Medicine

## 2018-11-29 VITALS — BP 132/88 | HR 60 | Temp 98.8°F | Resp 16 | Ht 68.0 in | Wt 158.4 lb

## 2018-11-29 DIAGNOSIS — J189 Pneumonia, unspecified organism: Secondary | ICD-10-CM

## 2018-11-29 DIAGNOSIS — J181 Lobar pneumonia, unspecified organism: Secondary | ICD-10-CM

## 2018-11-29 DIAGNOSIS — Z79899 Other long term (current) drug therapy: Secondary | ICD-10-CM

## 2018-11-29 DIAGNOSIS — I480 Paroxysmal atrial fibrillation: Secondary | ICD-10-CM | POA: Diagnosis not present

## 2018-11-29 MED ORDER — GUAIFENESIN-CODEINE 100-10 MG/5ML PO SOLN: 5.0000 mL | Freq: Four times a day (QID) | ORAL | 0 refills | Status: DC | PRN

## 2018-11-29 MED ORDER — AMIODARONE HCL 200 MG PO TABS: 100.0000 mg | ORAL_TABLET | Freq: Every day | ORAL | 0 refills | Status: DC

## 2018-11-29 MED ORDER — DOXYCYCLINE HYCLATE 100 MG PO TABS: 100.0000 mg | ORAL_TABLET | Freq: Two times a day (BID) | ORAL | 0 refills | Status: AC

## 2018-11-29 NOTE — Patient Instructions (Addendum)
Please return in 2 weeks for recheck.  Sooner if worsening.  Go to the hospital if you become short of breath.   If you have any questions or concerns, please don't hesitate to send me a message via MyChart or call the office at [phone]. Thank you for visiting with Tammy Harding today! It's our pleasure caring for you.   Community-Acquired Pneumonia, Adult Pneumonia is an infection of the lungs. There are different types of pneumonia. One type can develop while a person is in a hospital. A different type, called community-acquired pneumonia, develops in people who are not, or have not recently been, in the hospital or other health care facility. What are the causes?  Pneumonia may be caused by bacteria, viruses, or funguses. Community-acquired pneumonia is often caused by Streptococcus pneumonia bacteria. These bacteria are often passed from one person to another by breathing in droplets from the cough or sneeze of an infected person. What increases the risk? The condition is more likely to develop in:  People who havechronic diseases, such as chronic obstructive pulmonary disease (COPD), asthma, congestive heart failure, cystic fibrosis, diabetes, or kidney disease.  People who haveearly-stage or late-stage HIV.  People who havesickle cell disease.  People who havehad their spleen removed (splenectomy).  People who havepoor Human resources officer.  People who havemedical conditions that increase the risk of breathing in (aspirating) secretions their own mouth and nose.  People who havea weakened immune system (immunocompromised).  People who smoke.  People whotravel to areas where pneumonia-causing germs commonly exist.  People whoare around animal habitats or animals that have pneumonia-causing germs, including birds, bats, rabbits, cats, and farm animals. What are the signs or symptoms? Symptoms of this condition include:  Adry cough.  A wet (productive)  cough.  Fever.  Sweating.  Chest pain, especially when breathing deeply or coughing.  Rapid breathing or difficulty breathing.  Shortness of breath.  Shaking chills.  Fatigue.  Muscle aches. How is this diagnosed? Your health care provider will take a medical history and perform a physical exam. You may also have other tests, including:  Imaging studies of your chest, including X-rays.  Tests to check your blood oxygen level and other blood gases.  Other tests on blood, mucus (sputum), fluid around your lungs (pleural fluid), and urine. If your pneumonia is severe, other tests may be done to identify the specific cause of your illness. How is this treated? The type of treatment that you receive depends on many factors, such as the cause of your pneumonia, the medicines you take, and other medical conditions that you have. For most adults, treatment and recovery from pneumonia may occur at home. In some cases, treatment must happen in a hospital. Treatment may include:  Antibiotic medicines, if the pneumonia was caused by bacteria.  Antiviral medicines, if the pneumonia was caused by a virus.  Medicines that are given by mouth or through an IV tube.  Oxygen.  Respiratory therapy. Although rare, treating severe pneumonia may include:  Mechanical ventilation. This is done if you are not breathing well on your own and you cannot maintain a safe blood oxygen level.  Thoracentesis. This procedureremoves fluid around one lung or both lungs to help you breathe better. Follow these instructions at home:   Take over-the-counter and prescription medicines only as told by your health care provider. ? Only takecough medicine if you are losing sleep. Understand that cough medicine can prevent your body's natural ability to remove mucus from your lungs. ? If  you were prescribed an antibiotic medicine, take it as told by your health care provider. Do not stop taking the antibiotic  even if you start to feel better.  Sleep in a semi-upright position at night. Try sleeping in a reclining chair, or place a few pillows under your head.  Do not use tobacco products, including cigarettes, chewing tobacco, and e-cigarettes. If you need help quitting, ask your health care provider.  Drink enough water to keep your urine clear or pale yellow. This will help to thin out mucus secretions in your lungs. How is this prevented? There are ways that you can decrease your risk of developing community-acquired pneumonia. Consider getting a pneumococcal vaccine if:  You are older than 82 years of age.  You are older than 82 years of age and are undergoing cancer treatment, have chronic lung disease, or have other medical conditions that affect your immune system. Ask your health care provider if this applies to you. There are different types and schedules of pneumococcal vaccines. Ask your health care provider which vaccination option is best for you. You may also prevent community-acquired pneumonia if you take these actions:  Get an influenza vaccine every year. Ask your health care provider which type of influenza vaccine is best for you.  Go to the dentist on a regular basis.  Wash your hands often. Use hand sanitizer if soap and water are not available. Contact a health care provider if:  You have a fever.  You are losing sleep because you cannot control your cough with cough medicine. Get help right away if:  You have worsening shortness of breath.  You have increased chest pain.  Your sickness becomes worse, especially if you are an older adult or have a weakened immune system.  You cough up blood. This information is not intended to replace advice given to you by your health care provider. Make sure you discuss any questions you have with your health care provider. Document Released: 11/17/2005 Document Revised: 08/06/2017 Document Reviewed: 03/14/2015 Elsevier  Interactive Patient Education  2019 [employer].

## 2018-11-29 NOTE — Progress Notes (Signed)
Subjective   CC:  Chief Complaint  Patient presents with  . URI    Symptoms started 11/25/18.Marland Kitchen Has taken Coricidin.Marland Kitchen    HPI: Tammy Harding is a 84 y.o. female who presents to the office today to address the problems listed above in the chief complaint.  Patient complains of feeling poorly.  Started with cold symptoms 4 days ago.  Complains of sore throat and hacking persistent cough with malaise.  She has low energy.  She denies true shortness of breath but does complain of chest congestion.  She awoke with sweats last night.  She has not checked her temperature.  She denies dyspnea on exertion or pleuritic chest pain.  She has had no palpitations or orthopnea.  She will be traveling to stay with her sister tomorrow.  She denies abdominal pain, nausea, vomiting or diarrhea.  Appetite has been decreased but she is trying to keep hydrated.  PAF: Needs amiodarone refilled.  She has seen the cardiologist.  She needs to get this before she travels to [place].   Assessment  1. Community acquired pneumonia of right lower lobe of lung ([place])   2. Paroxysmal atrial fibrillation ([place])   3. High risk medication use      Plan   possible community-acquired pneumonia versus bronchitis: Clinically has mild rales on right but respiratory status is stable.  Treat with doxycycline and cough medication.  Education given for supportive care.  Rest, hydration.  If becomes short of breath or has chest pain she will go to the emergency room.  Otherwise recheck here in the office in 1 to 2 weeks.  She will be traveling to [facility] but her sister is driving.  She appears stable for the short trip.  Check CBC  I refilled amiodarone today just to bridge her while she is gone.  Further refills will be managed by cardiology. Follow up: Return in about 2 weeks (around 12/13/2018) for recheck pneumonia.   Orders Placed This Encounter  Procedures  . CBC with Differential/Platelet   Meds ordered this  encounter  Medications  . amiodarone (PACERONE) 200 MG tablet    Sig: Take 0.5 tablets (100 mg total) by mouth daily.    Dispense:  30 tablet    Refill:  0  . doxycycline (VIBRA-TABS) 100 MG tablet    Sig: Take 1 tablet (100 mg total) by mouth 2 (two) times daily for 7 days.    Dispense:  14 tablet    Refill:  0  . guaiFENesin-codeine 100-10 MG/5ML syrup    Sig: Take 5 mLs by mouth every 6 (six) hours as needed for cough.    Dispense:  120 mL    Refill:  0      I reviewed the patients updated PMH, FH, and SocHx.    Patient Active Problem List   Diagnosis Date Noted  . Long term prescription benzodiazepine use 10/12/2018  . Postoperative hypothyroidism 08/19/2018  . History of thyroid cancer 08/19/2018  . Chronic anxiety 08/19/2018  . Lung nodule 09/02/2016  . Paroxysmal atrial fibrillation ([place]) 08/30/2015  . Mixed hyperlipidemia 02/27/2015  . Essential hypertension 08/29/2014  . Osteoporosis 04/03/2014  . Vitamin D deficiency 06/24/2012  . Insomnia 08/13/2010  . IBS (irritable bowel syndrome) 08/07/2009  . COPD (chronic obstructive pulmonary disease) ([place]) 05/22/2009  . Gout 12/21/2008  . Allergic rhinitis due to pollen 03/27/2005  . Esophageal reflux 05/03/2004  . Generalized anxiety disorder 04/23/1999   Current Meds  Medication Sig  .  ALPRAZolam (XANAX) 0.25 MG tablet Take 1 tablet (0.25 mg total) by mouth at bedtime as needed for anxiety.  Marland Kitchen amiodarone (PACERONE) 200 MG tablet Take 0.5 tablets (100 mg total) by mouth daily.  Marland Kitchen aspirin 81 MG chewable tablet Chew 162 mg by mouth daily.   . carvedilol (COREG) 12.5 MG tablet Take 12.5 mg by mouth 2 (two) times daily.  . cloNIDine (CATAPRES) 0.1 MG tablet Take 0.1 mg by mouth 3 (three) times daily.   . hydrALAZINE (APRESOLINE) 25 MG tablet Take 25 mg by mouth 3 (three) times daily.   . Lactobacillus-Inulin ([place]) CAPS Take 1 capsule by mouth daily.  Marland Kitchen levothyroxine (SYNTHROID, LEVOTHROID) 150 MCG  tablet TAKE 1 TABLET BY MOUTH EVERY DAY  . PARoxetine (PAXIL) 10 MG tablet Take 10 mg by mouth daily.  . [DISCONTINUED] amiodarone (PACERONE) 200 MG tablet Take 100 mg by mouth daily.     Review of Systems: Constitutional: Negative for fever malaise or anorexia Cardiovascular: negative for chest pain Respiratory: negative for SOB or pleuritic chest pain Gastrointestinal: negative for abdominal pain  Objective  Vitals: BP 132/88   Pulse 60   Temp 98.8 F (37.1 C) (Oral)   Resp 16   Ht 5\' 8"  (1.727 m)   Wt 158 lb 6.4 oz (71.8 kg)   SpO2 96%   BMI 24.08 kg/m  General: no acute respiratory distress, nontoxic-appearing Psych:  Alert and oriented, normal mood and affect HEENT: Normocephalic, nasal congestion present, TMs w/o erythema, OP with erythema w/o exudate, + cervical LAD, supple neck  Cardiovascular:  RRR without murmur or gallop. no peripheral edema Respiratory:  Good breath sounds bilaterally, mild rales at right base, no wheezing or rhonchi, good air movement.  No tachypnea or retractions Skin:  Warm, no rashes Neurologic:   Mental status is normal. normal gait  Commons side effects, risks, benefits, and alternatives for medications and treatment plan prescribed today were discussed, and the patient expressed understanding of the given instructions. Patient is instructed to call or message via MyChart if he/she has any questions or concerns regarding our treatment plan. No barriers to understanding were identified. We discussed Red Flag symptoms and signs in detail. Patient expressed understanding regarding what to do in case of urgent or emergency type symptoms.   Medication list was reconciled, printed and provided to the patient in AVS. Patient instructions and summary information was reviewed with the patient as documented in the AVS. This note was prepared with assistance of Dragon voice recognition software. Occasional wrong-word or sound-a-like substitutions may have  occurred due to the inherent limitations of voice recognition software

## 2018-11-30 LAB — CBC WITH DIFFERENTIAL/PLATELET
BASOS PCT: 1.2 % (ref 0.0–3.0)
Basophils Absolute: 0.1 10*3/uL (ref 0.0–0.1)
EOS PCT: 2.4 % (ref 0.0–5.0)
Eosinophils Absolute: 0.2 10*3/uL (ref 0.0–0.7)
HEMATOCRIT: 37.3 % (ref 36.0–46.0)
HEMOGLOBIN: 12.4 g/dL (ref 12.0–15.0)
LYMPHS PCT: 17.6 % (ref 12.0–46.0)
Lymphs Abs: 1.1 10*3/uL (ref 0.7–4.0)
MCHC: 33.3 g/dL (ref 30.0–36.0)
MCV: 83.4 fl (ref 78.0–100.0)
Monocytes Absolute: 0.7 10*3/uL (ref 0.1–1.0)
Monocytes Relative: 10.1 % (ref 3.0–12.0)
Neutro Abs: 4.5 10*3/uL (ref 1.4–7.7)
Neutrophils Relative %: 68.7 % (ref 43.0–77.0)
Platelets: 197 10*3/uL (ref 150.0–400.0)
RBC: 4.47 Mil/uL (ref 3.87–5.11)
RDW: 14.7 % (ref 11.5–15.5)
WBC: 6.5 10*3/uL (ref 4.0–10.5)

## 2018-12-13 ENCOUNTER — Ambulatory Visit (INDEPENDENT_AMBULATORY_CARE_PROVIDER_SITE_OTHER): Payer: Medicare Other | Admitting: Family Medicine

## 2018-12-13 ENCOUNTER — Encounter: Payer: Self-pay | Admitting: Family Medicine

## 2018-12-13 ENCOUNTER — Other Ambulatory Visit: Payer: Self-pay

## 2018-12-13 VITALS — BP 126/74 | HR 67 | Temp 97.7°F | Resp 18 | Ht 68.0 in | Wt 156.8 lb

## 2018-12-13 DIAGNOSIS — Z8701 Personal history of pneumonia (recurrent): Secondary | ICD-10-CM | POA: Diagnosis not present

## 2018-12-13 DIAGNOSIS — J189 Pneumonia, unspecified organism: Secondary | ICD-10-CM

## 2018-12-13 DIAGNOSIS — J181 Lobar pneumonia, unspecified organism: Principal | ICD-10-CM

## 2018-12-13 NOTE — Progress Notes (Signed)
Subjective  CC:  Chief Complaint  Patient presents with  . Follow-up  . Pneumonia    States that she feeling    HPI: Tammy Harding is a 85 y.o. female who presents to the office today to address the problems listed above in the chief complaint.  85 year old female here for follow-up of bronchitis versus community-acquired pneumonia.  She was seen prior to the new year and treated with antibiotics.  She has done well.  She feels fully recovered.  No longer with cough, shortness of breath, chest congestion or fevers.  Energy level is normal.  She did not have a chest x-ray.  Blood count at that time was normal.  Health maintenance: Due for annual exam and annual wellness visit.  Also due for fasting lab work. Assessment  1. Community acquired pneumonia of right lower lobe of lung ([place])      Plan   Community-acquired pneumonia: Resolved.  In for physical and annual wellness visit.  See after visit summary.  Follow up: Return in about 4 weeks (around 01/10/2019) for complete physical, AWV at patient's convenience.  Visit date not found  No orders of the defined types were placed in this encounter.  No orders of the defined types were placed in this encounter.     I reviewed the patients updated PMH, FH, and SocHx.    Patient Active Problem List   Diagnosis Date Noted  . Long term prescription benzodiazepine use 10/12/2018  . Postoperative hypothyroidism 08/19/2018  . History of thyroid cancer 08/19/2018  . Chronic anxiety 08/19/2018  . Lung nodule 09/02/2016  . Paroxysmal atrial fibrillation ([place]) 08/30/2015  . Mixed hyperlipidemia 02/27/2015  . Essential hypertension 08/29/2014  . Osteoporosis 04/03/2014  . Vitamin D deficiency 06/24/2012  . Insomnia 08/13/2010  . IBS (irritable bowel syndrome) 08/07/2009  . COPD (chronic obstructive pulmonary disease) ([place]) 05/22/2009  . Gout 12/21/2008  . Allergic rhinitis due to pollen 03/27/2005  . Esophageal reflux 05/03/2004    . Generalized anxiety disorder 04/23/1999   Current Meds  Medication Sig  . ALPRAZolam (XANAX) 0.25 MG tablet Take 1 tablet (0.25 mg total) by mouth at bedtime as needed for anxiety.  Marland Kitchen amiodarone (PACERONE) 200 MG tablet Take 0.5 tablets (100 mg total) by mouth daily.  Marland Kitchen aspirin 81 MG chewable tablet Chew 162 mg by mouth daily.   . carvedilol (COREG) 12.5 MG tablet Take 12.5 mg by mouth 2 (two) times daily.  . cloNIDine (CATAPRES) 0.1 MG tablet Take 0.1 mg by mouth 3 (three) times daily.   . hydrALAZINE (APRESOLINE) 25 MG tablet Take 25 mg by mouth 3 (three) times daily.   . Lactobacillus-Inulin ([place]) CAPS Take 1 capsule by mouth daily.  Marland Kitchen levothyroxine (SYNTHROID, LEVOTHROID) 150 MCG tablet TAKE 1 TABLET BY MOUTH EVERY DAY  . PARoxetine (PAXIL) 10 MG tablet Take 10 mg by mouth daily.    Allergies: Patient is allergic to penicillins; amlodipine; bee venom; hctz [hydrochlorothiazide]; horse epithelium; hydralazine; lisinopril; and losartan. Family History: Patient family history includes Arthritis in her father and mother; Depression in her sister; Healthy in her son; Heart attack in her father; Hypertension in her mother, sister, and sister; Kidney disease in her mother. Social History:  Patient  reports that she has never smoked. She has never used smokeless tobacco. She reports that she does not drink alcohol or use drugs.  Review of Systems: Constitutional: Negative for fever malaise or anorexia Cardiovascular: negative for chest pain Respiratory: negative for SOB  or persistent cough Gastrointestinal: negative for abdominal pain  Objective  Vitals: BP 126/74   Pulse 67   Temp 97.7 F (36.5 C) (Oral)   Resp 18   Ht 5\' 8"  (1.727 m)   Wt 156 lb 12.8 oz (71.1 kg)   SpO2 95%   BMI 23.84 kg/m  General: no acute distress , A&Ox3 HEENT: PEERL, conjunctiva normal, Oropharynx moist,neck is supple Cardiovascular:  RRR without murmur or gallop.   Respiratory:  Good breath sounds bilaterally, CTAB with normal respiratory effort Skin:  Warm, no rashes     Commons side effects, risks, benefits, and alternatives for medications and treatment plan prescribed today were discussed, and the patient expressed understanding of the given instructions. Patient is instructed to call or message via MyChart if he/she has any questions or concerns regarding our treatment plan. No barriers to understanding were identified. We discussed Red Flag symptoms and signs in detail. Patient expressed understanding regarding what to do in case of urgent or emergency type symptoms.   Medication list was reconciled, printed and provided to the patient in AVS. Patient instructions and summary information was reviewed with the patient as documented in the AVS. This note was prepared with assistance of Dragon voice recognition software. Occasional wrong-word or sound-a-like substitutions may have occurred due to the inherent limitations of voice recognition software

## 2018-12-13 NOTE — Patient Instructions (Addendum)
Please return in 1-6 weeks for your annual complete physical; please come fasting.  Also can schedule the Annual Wellness visit, medicare with Maudie Mercury in the spring or summer.  Medicare recommends an Annual Wellness Visit for all patients. Please schedule this to be done with our Nurse Educator, Maudie Mercury. This is an informative "talk" visit; it's goals are to ensure that your health care needs are being met and to give you education regarding avoiding falls, ensuring you are not suffering from depression or problems with memory or thinking, and to educate you on Advance Care Planning. It helps me take good care of you!   If you have any questions or concerns, please don't hesitate to send me a message via MyChart or call the office at [phone]. Thank you for visiting with [place] today! It's our pleasure caring for you.

## 2018-12-30 ENCOUNTER — Telehealth: Payer: Self-pay | Admitting: Family Medicine

## 2018-12-30 ENCOUNTER — Ambulatory Visit (INDEPENDENT_AMBULATORY_CARE_PROVIDER_SITE_OTHER): Payer: Medicare Other | Admitting: Family Medicine

## 2018-12-30 ENCOUNTER — Encounter: Payer: Self-pay | Admitting: Family Medicine

## 2018-12-30 ENCOUNTER — Other Ambulatory Visit: Payer: Self-pay

## 2018-12-30 VITALS — BP 144/94 | HR 62 | Temp 97.9°F | Resp 15 | Ht 69.0 in | Wt 157.2 lb

## 2018-12-30 DIAGNOSIS — E782 Mixed hyperlipidemia: Secondary | ICD-10-CM

## 2018-12-30 DIAGNOSIS — M810 Age-related osteoporosis without current pathological fracture: Secondary | ICD-10-CM

## 2018-12-30 DIAGNOSIS — E890 Postprocedural hypothyroidism: Secondary | ICD-10-CM

## 2018-12-30 DIAGNOSIS — J439 Emphysema, unspecified: Secondary | ICD-10-CM

## 2018-12-30 DIAGNOSIS — F5101 Primary insomnia: Secondary | ICD-10-CM

## 2018-12-30 DIAGNOSIS — E559 Vitamin D deficiency, unspecified: Secondary | ICD-10-CM

## 2018-12-30 DIAGNOSIS — I480 Paroxysmal atrial fibrillation: Secondary | ICD-10-CM

## 2018-12-30 DIAGNOSIS — Z85850 Personal history of malignant neoplasm of thyroid: Secondary | ICD-10-CM | POA: Diagnosis not present

## 2018-12-30 DIAGNOSIS — M109 Gout, unspecified: Secondary | ICD-10-CM | POA: Diagnosis not present

## 2018-12-30 DIAGNOSIS — I10 Essential (primary) hypertension: Secondary | ICD-10-CM | POA: Diagnosis not present

## 2018-12-30 LAB — CBC WITH DIFFERENTIAL/PLATELET
Basophils Absolute: 0.1 10*3/uL (ref 0.0–0.1)
Basophils Relative: 1.7 % (ref 0.0–3.0)
Eosinophils Absolute: 0.1 10*3/uL (ref 0.0–0.7)
Eosinophils Relative: 3.6 % (ref 0.0–5.0)
HCT: 38.9 % (ref 36.0–46.0)
Hemoglobin: 12.9 g/dL (ref 12.0–15.0)
Lymphocytes Relative: 17.6 % (ref 12.0–46.0)
Lymphs Abs: 0.7 10*3/uL (ref 0.7–4.0)
MCHC: 33.2 g/dL (ref 30.0–36.0)
MCV: 83.3 fl (ref 78.0–100.0)
Monocytes Absolute: 0.4 10*3/uL (ref 0.1–1.0)
Monocytes Relative: 9.6 % (ref 3.0–12.0)
Neutro Abs: 2.5 10*3/uL (ref 1.4–7.7)
Neutrophils Relative %: 67.5 % (ref 43.0–77.0)
Platelets: 205 10*3/uL (ref 150.0–400.0)
RBC: 4.67 Mil/uL (ref 3.87–5.11)
RDW: 14.4 % (ref 11.5–15.5)
WBC: 3.7 10*3/uL — ABNORMAL LOW (ref 4.0–10.5)

## 2018-12-30 LAB — LIPID PANEL
Cholesterol: 204 mg/dL — ABNORMAL HIGH (ref 0–200)
HDL: 61 mg/dL (ref >39.00)
LDL Cholesterol: 127 mg/dL — ABNORMAL HIGH (ref 0–99)
NonHDL: 142.82
Total CHOL/HDL Ratio: 3
Triglycerides: 77 mg/dL (ref 0.0–149.0)
VLDL: 15.4 mg/dL (ref 0.0–40.0)

## 2018-12-30 LAB — COMPREHENSIVE METABOLIC PANEL
ALT: 11 U/L (ref 0–35)
AST: 15 U/L (ref 0–37)
Albumin: 4.1 g/dL (ref 3.5–5.2)
Alkaline Phosphatase: 67 U/L (ref 39–117)
BILIRUBIN TOTAL: 0.6 mg/dL (ref 0.2–1.2)
BUN: 13 mg/dL (ref 6–23)
CO2: 32 mEq/L (ref 19–32)
CREATININE: 1.08 mg/dL (ref 0.40–1.20)
Calcium: 9.2 mg/dL (ref 8.4–10.5)
Chloride: 92 mEq/L — ABNORMAL LOW (ref 96–112)
GFR: 48.23 mL/min — ABNORMAL LOW (ref >60.00)
Glucose, Bld: 105 mg/dL — ABNORMAL HIGH (ref 70–99)
Potassium: 4.3 mEq/L (ref 3.5–5.1)
Sodium: 129 mEq/L — ABNORMAL LOW (ref 135–145)
Total Protein: 6.6 g/dL (ref 6.0–8.3)

## 2018-12-30 LAB — TSH: TSH: 0.67 u[IU]/mL (ref 0.35–4.50)

## 2018-12-30 LAB — URIC ACID: URIC ACID, SERUM: 5.1 mg/dL (ref 2.4–7.0)

## 2018-12-30 LAB — VITAMIN D 25 HYDROXY (VIT D DEFICIENCY, FRACTURES): VITD: 32.65 ng/mL (ref 30.00–100.00)

## 2018-12-30 MED ORDER — BUDESONIDE-FORMOTEROL FUMARATE 80-4.5 MCG/ACT IN AERO: 2.0000 | INHALATION_SPRAY | Freq: Two times a day (BID) | RESPIRATORY_TRACT | 11 refills | Status: DC

## 2018-12-30 MED ORDER — UMECLIDINIUM-VILANTEROL 62.5-25 MCG/INH IN AEPB: 1.0000 | INHALATION_SPRAY | Freq: Every day | RESPIRATORY_TRACT | 11 refills | Status: DC

## 2018-12-30 NOTE — Telephone Encounter (Signed)
Pt  States that the Symbicort inhaler will cost her $500.00 and that she can not afford that. Pt asking if there is anything else that could be called in that would be cheaper. Please advise.

## 2018-12-30 NOTE — Telephone Encounter (Signed)
Please notify pt I changed inhalers. Let us know if this one is less expensive. If it isn't, she may need to ask pharmacist which copd inhaler would be cheaper or we can look at her formulary.  spiriva or atrovent would be other possible cheaper alternatives. thanks

## 2018-12-30 NOTE — Patient Instructions (Addendum)
Please schedule the medicare annual wellness visit at patient's convenience.  Please return in 6 months for follow up of your hypertension.   We will call you with your results or send a letter.   We will call you with information regarding your referral appointment. [facility].  If you do not hear from [place] within the next 2 weeks, please let me know. It can take 1-2 weeks to get appointments set up with the specialists.   Please start using the Symbicort inhaler twice daily. This should help your breathing.   If you have any questions or concerns, please don't hesitate to send me a message via MyChart or call the office at [phone]. Thank you for visiting with [place] today! It's our pleasure caring for you.   How to Use a Metered Dose Inhaler A metered dose inhaler is a handheld device for taking medicine that must be breathed into the lungs (inhaled). The device can be used to deliver a variety of inhaled medicines, including:  Quick relief or rescue medicines, such as bronchodilators.  Controller medicines, such as corticosteroids. The medicine is delivered by pushing down on a metal canister to release a preset amount of spray and medicine. Each device contains the amount of medicine that is needed for a preset number of uses (inhalations). Your health care provider may recommend that you use a spacer with your inhaler to help you take the medicine more effectively. A spacer is a plastic tube with a mouthpiece on one end and an opening that connects to the inhaler on the other end. A spacer holds the medicine in a tube for a short time, which allows you to inhale more medicine. What are the risks? If you do not use your inhaler correctly, medicine might not reach your lungs to help you breathe. Inhaler medicine can cause side effects, such as:  Mouth or throat infection.  Cough.  Hoarseness.  Headache.  Nausea and vomiting.  Lung infection  (pneumonia) in people who have a lung condition called COPD. How to use a metered dose inhaler without a spacer  1. Remove the cap from the inhaler. 2. If you are using the inhaler for the first time, shake it for 5 seconds, turn it away from your face, then release 4 puffs into the air. This is called priming. 3. Shake the inhaler for 5 seconds. 4. Position the inhaler so the top of the canister faces up. 5. Put your index finger on the top of the medicine canister. Support the bottom of the inhaler with your thumb. 6. Breathe out normally and as completely as possible, away from the inhaler. 7. Either place the inhaler between your teeth and close your lips tightly around the mouthpiece, or hold the inhaler 1-2 inches (2.5-5 cm) away from your open mouth. Keep your tongue down out of the way. If you are unsure which technique to use, ask your health care provider. 8. Press the canister down with your index finger to release the medicine, then inhale deeply and slowly through your mouth (not your nose) until your lungs are completely filled. Inhaling should take 4-6 seconds. 9. Hold the medicine in your lungs for 5-10 seconds (10 seconds is best). This helps the medicine get into the small airways of your lungs. 10. With your lips in a tight circle (pursed), breathe out slowly. 11. Repeat steps 3-10 until you have taken the number of puffs that your health care provider directed. Wait  about 1 minute between puffs or as directed. 12. Put the cap on the inhaler. 13. If you are using a steroid inhaler, rinse your mouth with water, gargle, and spit out the water. Do not swallow the water. How to use a metered dose inhaler with a spacer  1. Remove the cap from the inhaler. 2. If you are using the inhaler for the first time, shake it for 5 seconds, turn it away from your face, then release 4 puffs into the air. This is called priming. 3. Shake the inhaler for 5 seconds. 4. Place the open end of the  spacer onto the inhaler mouthpiece. 5. Position the inhaler so the top of the canister faces up and the spacer mouthpiece faces you. 6. Put your index finger on the top of the medicine canister. Support the bottom of the inhaler and the spacer with your thumb. 7. Breathe out normally and as completely as possible, away from the spacer. 8. Place the spacer between your teeth and close your lips tightly around it. Keep your tongue down out of the way. 9. Press the canister down with your index finger to release the medicine, then inhale deeply and slowly through your mouth (not your nose) until your lungs are completely filled. Inhaling should take 4-6 seconds. 10. Hold the medicine in your lungs for 5-10 seconds (10 seconds is best). This helps the medicine get into the small airways of your lungs. 11. With your lips in a tight circle (pursed), breathe out slowly. 12. Repeat steps 3-11 until you have taken the number of puffs that your health care provider directed. Wait about 1 minute between puffs or as directed. 13. Remove the spacer from the inhaler and put the cap on the inhaler. 14. If you are using a steroid inhaler, rinse your mouth with water, gargle, and spit out the water. Do not swallow the water. Follow these instructions at home:  Take your inhaled medicine only as told by your health care provider. Do not use the inhaler more than directed by your health care provider.  Keep all follow-up visits as told by your health care provider. This is important.  If your inhaler has a counter, you can check it to determine how full your inhaler is. If your inhaler does not have a counter, ask your health care provider when you will need to refill your inhaler and write the refill date on a calendar or on your inhaler canister. Note that you cannot know when an inhaler is empty by shaking it.  Follow directions on the package insert for care and cleaning of your inhaler and spacer. Contact a  health care provider if:  Symptoms are only partially relieved with your inhaler.  You are having trouble using your inhaler.  You have an increase in phlegm.  You have headaches. Get help right away if:  You feel little or no relief after using your inhaler.  You have dizziness.  You have a fast heart rate.  You have chills or a fever.  You have night sweats.  There is blood in your phlegm. Summary  A metered dose inhaler is a handheld device for taking medicine that must be breathed into the lungs (inhaled).  The medicine is delivered by pushing down on a metal canister to release a preset amount of spray and medicine.  Each device contains the amount of medicine that is needed for a preset number of uses (inhalations). This information is not intended to replace  advice given to you by your health care provider. Make sure you discuss any questions you have with your health care provider. Document Released: 11/17/2005 Document Revised: 06/08/2017 Document Reviewed: 10/07/2016 Elsevier Interactive Patient Education  2019 [employer].

## 2018-12-30 NOTE — Progress Notes (Signed)
Subjective    Chief Complaint  Patient presents with  . Annual Exam    okay to take colace?    HPI: Tammy Harding is a 85 y.o. female who presents to [place] at [facility] today for a Female Wellness Visit. She also has the concerns and/or needs as listed above in the chief complaint. These will be addressed in addition to the Health Maintenance Visit.  fasting Wellness Visit: annual visit with health maintenance review and exam without Pap   Due dexa; last screen was 2013; had h/o osteoporosis and vit D deficiency.  Chronic disease f/u and/or acute problem visit: (deemed necessary to be done in addition to the wellness visit):  Hypertension, hypothyroidism, PAF, hyperlipidemia are all stable.  She reports that she feels well.  Denies chest pain, palpitations, problems with fatigue or lower extremity edema.  She is compliant with all of her medications.  She is fasting today for lab testing.  She is on a statin and denies myalgias.  COPD: Not currently on any medications.  She does admit to shortness of breath with walking and occasional wheezing.  She has used an inhaler in the past but it has been many years.  She denies daily productive cough or chest pain.  Had PFTs back in 2011.  History of osteoporosis with due bone density screening.  History of vitamin D deficiency.  No recent falls or fractures.  History of gout.  No recent flares.  Assessment  1. Essential hypertension   2. Postoperative hypothyroidism   3. History of thyroid cancer   4. Paroxysmal atrial fibrillation (HCC)   5. Mixed hyperlipidemia   6. Age-related osteoporosis without current pathological fracture   7. Primary insomnia   8. Gout, unspecified cause, unspecified chronicity, unspecified site   9. Pulmonary emphysema, unspecified emphysema type ([place])   10. Vitamin D deficiency      Plan  Female Wellness Visit:  Age appropriate Health Maintenance and Prevention measures were  discussed with patient. Included topics are cancer screening recommendations, ways to keep healthy (see AVS) including dietary and exercise recommendations, regular eye and dental care, use of seat belts, and avoidance of moderate alcohol use and tobacco use.  Recommend bone density.  She would be a Prolia candidate if remains osteoporotic.  BMI: discussed patient's BMI and encouraged positive lifestyle modifications to help get to or maintain a target BMI.  HM needs and immunizations were addressed and ordered. See below for orders. See HM and immunization section for updates.  Routine labs and screening tests ordered including cmp, cbc and lipids where appropriate.  Discussed recommendations regarding Vit D and calcium supplementation (see AVS) recheck vitamin D levels.  Chronic disease management visit and/or acute problem visit:  Hypertension is typically well controlled.  She admits she is mildly anxious today given her need for blood work.  Most recent visits were normal.  No change in medications today.  Will recheck in 6 months.  Check renal function electrolytes  Hyperlipidemia on statin.  Recheck fasting levels today.  Check LFTs.  Recheck thyroid levels.  She is compliant with her medications.  Clinically she is euthyroid  COPD: Dyspnea on exertion.  Recommend starting maintenance inhaler.  Will check to see which her insurance covers past.  Insomnia stable on chronic benzos.  She understands risks versus benefits.  History of PAF: Currently in sinus rhythm.  No chest pain or symptoms of CHF.  Follow up: Return in about 6 months (around 06/30/2019) for  follow up Hypertension, AWV at patient's convenience.  Orders Placed This Encounter  Procedures  . DG Bone Density  . CBC with Differential/Platelet  . Comprehensive metabolic panel  . Lipid panel  . TSH  . Uric acid  . VITAMIN D 25 Hydroxy (Vit-D Deficiency, Fractures)   Meds ordered this encounter  Medications  .  budesonide-formoterol (SYMBICORT) 80-4.5 MCG/ACT inhaler    Sig: Inhale 2 puffs into the lungs 2 (two) times daily.    Dispense:  1 Inhaler    Refill:  11      Lifestyle: Body mass index is 23.21 kg/m. Wt Readings from Last 3 Encounters:  12/30/18 157 lb 3.2 oz (71.3 kg)  12/13/18 156 lb 12.8 oz (71.1 kg)  11/29/18 158 lb 6.4 oz (71.8 kg)    Patient Active Problem List   Diagnosis Date Noted  . Long term prescription benzodiazepine use 10/12/2018  . Postoperative hypothyroidism 08/19/2018  . History of thyroid cancer 08/19/2018  . Chronic anxiety 08/19/2018  . Lung nodule 09/02/2016  . Paroxysmal atrial fibrillation ([place]) 08/30/2015    controlled witht paceron in sinus   . Mixed hyperlipidemia 02/27/2015    Hyperlipidemia   . Essential hypertension 08/29/2014    Essential hypertension   . Osteoporosis 04/03/2014    cannot tolerate fosamax IMO Load 2016 R1.3   . Vitamin D deficiency 06/24/2012    IMO Load 2016 R1.3   . Insomnia 08/13/2010  . IBS (irritable bowel syndrome) 08/07/2009  . COPD (chronic obstructive pulmonary disease) ([place]) 05/22/2009    Moderate reversible defect on pft April 2010   . Gout 12/21/2008    ICD10 Conversion   . Allergic rhinitis due to pollen 03/27/2005  . Esophageal reflux 05/03/2004  . Generalized anxiety disorder 04/23/1999   Health Maintenance  Topic Date Due  . DEXA SCAN  12/01/2014  . INFLUENZA VACCINE  Completed  . TETANUS/TDAP  Discontinued  . PNA vac Low Risk Adult  Discontinued   Immunization History  Administered Date(s) Administered  . Influenza Whole 11/09/2008, 09/13/2009, 10/15/2010  . Influenza, High Dose Seasonal PF 09/16/2011, 09/30/2012, 10/31/2013, 10/31/2014, 10/17/2015, 09/01/2016, 10/12/2018  . Influenza,inj,Quad PF,6+ Mos 12/03/2017  . Influenza-Unspecified 10/11/1999, 12/09/2000, 09/23/2002, 09/12/2003, 10/18/2004, 11/07/2005, 10/15/2006, 11/18/2007  . Pneumococcal-Unspecified 10/10/2003, 09/24/2009    We updated and reviewed the patient's past history in detail and it is documented below. Allergies: Patient is allergic to penicillins; amlodipine; bee venom; hctz [hydrochlorothiazide]; horse epithelium; hydralazine; lisinopril; and losartan. Past Medical History Patient  has a past medical history of Allergy, Anxiety, Arthritis, Chronic anxiety (08/19/2018), Chronic atrial fibrillation (08/19/2018), Depression, Essential hypertension (08/19/2018), and Thyroid cancer ([place]). Past Surgical History Patient  has a past surgical history that includes Appendectomy; Thyroidectomy; and Tonsillectomy and adenoidectomy. Family History: Patient family history includes Arthritis in her father and mother; Depression in her sister; Healthy in her son; Heart attack in her father; Hypertension in her mother, sister, and sister; Kidney disease in her mother. Social History:  Patient  reports that she has never smoked. She has never used smokeless tobacco. She reports that she does not drink alcohol or use drugs.  Review of Systems: Constitutional: negative for fever or malaise Ophthalmic: negative for photophobia, double vision or loss of vision Cardiovascular: negative for chest pain, positive for dyspnea on exertion, negative new LE swelling Respiratory: negative for SOB or persistent cough Gastrointestinal: negative for abdominal pain, change in bowel habits or melena Genitourinary: negative for dysuria or gross hematuria, no abnormal uterine bleeding or disharge Musculoskeletal:  negative for new gait disturbance or muscular weakness Integumentary: negative for new or persistent rashes, no breast lumps Neurological: negative for TIA or stroke symptoms Psychiatric: negative for SI or delusions Allergic/Immunologic: negative for hives  Patient Care Team    Relationship Specialty Notifications Start End  Leamon Arnt, MD PCP - General Family Medicine  08/19/18    BP Readings from Last 3 Encounters:   12/30/18 (!) 144/94  12/13/18 126/74  11/29/18 132/88    Objective  Vitals: BP (!) 144/94 (BP Location: Left Arm)   Pulse 62   Temp 97.9 F (36.6 C) (Oral)   Resp 15   Ht 5\' 9"  (1.753 m)   Wt 157 lb 3.2 oz (71.3 kg)   SpO2 98%   BMI 23.21 kg/m  General:  Well developed, well nourished, no acute distress  Psych:  Alert and orientedx3,normal mood and affect HEENT:  Normocephalic, atraumatic, non-icteric sclera, PERRL, oropharynx is clear without mass or exudate, supple neck without adenopathy, mass or thyromegaly Cardiovascular:  Normal S1, S2, RRR without gallop, rub or murmur, nondisplaced PMI Respiratory:  Good breath sounds bilaterally, CTAB with normal respiratory effort Gastrointestinal: normal bowel sounds, soft, non-tender, no noted masses. No HSM MSK: OA changes in bilateral hands joints are without erythema or swelling. Spine and CVA region are nontender Skin:  Warm, no rashes or suspicious lesions noted Neurologic:    Mental status is normal.  Gross motor and sensory exams are normal. Normal gait. No tremor    Commons side effects, risks, benefits, and alternatives for medications and treatment plan prescribed today were discussed, and the patient expressed understanding of the given instructions. Patient is instructed to call or message via MyChart if he/she has any questions or concerns regarding our treatment plan. No barriers to understanding were identified. We discussed Red Flag symptoms and signs in detail. Patient expressed understanding regarding what to do in case of urgent or emergency type symptoms.   Medication list was reconciled, printed and provided to the patient in AVS. Patient instructions and summary information was reviewed with the patient as documented in the AVS. This note was prepared with assistance of Dragon voice recognition software. Occasional wrong-word or sound-a-like substitutions may have occurred due to the inherent limitations of voice  recognition software

## 2018-12-30 NOTE — Telephone Encounter (Signed)
Please advise 

## 2018-12-30 NOTE — Telephone Encounter (Signed)
I changed to a different inhaler

## 2018-12-30 NOTE — Telephone Encounter (Signed)
Pt aware.

## 2018-12-31 ENCOUNTER — Encounter: Payer: Self-pay | Admitting: *Deleted

## 2018-12-31 ENCOUNTER — Other Ambulatory Visit: Payer: Self-pay | Admitting: *Deleted

## 2018-12-31 DIAGNOSIS — Z79899 Other long term (current) drug therapy: Secondary | ICD-10-CM

## 2018-12-31 NOTE — Progress Notes (Signed)
Please call patient: I have reviewed his/her lab results. Labs look mostly fine. Sodium level (salt in the blood) is mildly low. I recommend limiting fluid intake to no more than 1.5 ltr /day and eating enough foods and salts in the diet (ie, don't avoid salty foods for now). I will recheck in 6 months. It may be related to the amiodarone. Please order cxr to ensure normal on amiodarone. Pt can get anytime. thanks

## 2019-01-05 ENCOUNTER — Other Ambulatory Visit: Payer: Self-pay | Admitting: Family Medicine

## 2019-01-18 DIAGNOSIS — H2512 Age-related nuclear cataract, left eye: Secondary | ICD-10-CM | POA: Diagnosis not present

## 2019-01-18 DIAGNOSIS — H35033 Hypertensive retinopathy, bilateral: Secondary | ICD-10-CM | POA: Diagnosis not present

## 2019-01-18 DIAGNOSIS — H353113 Nonexudative age-related macular degeneration, right eye, advanced atrophic without subfoveal involvement: Secondary | ICD-10-CM | POA: Diagnosis not present

## 2019-01-18 DIAGNOSIS — H3561 Retinal hemorrhage, right eye: Secondary | ICD-10-CM | POA: Diagnosis not present

## 2019-01-18 DIAGNOSIS — H353193 Nonexudative age-related macular degeneration, unspecified eye, advanced atrophic without subfoveal involvement: Secondary | ICD-10-CM | POA: Diagnosis not present

## 2019-01-23 ENCOUNTER — Other Ambulatory Visit: Payer: Self-pay | Admitting: Family Medicine

## 2019-01-27 ENCOUNTER — Ambulatory Visit (INDEPENDENT_AMBULATORY_CARE_PROVIDER_SITE_OTHER): Payer: Medicare Other

## 2019-01-27 DIAGNOSIS — Z79899 Other long term (current) drug therapy: Secondary | ICD-10-CM

## 2019-02-01 NOTE — Progress Notes (Signed)
Please call patient: I have reviewed his/her lab results. Please let pt know that chest xray is normal.

## 2019-04-06 ENCOUNTER — Other Ambulatory Visit: Payer: Self-pay | Admitting: Family Medicine

## 2019-05-20 DIAGNOSIS — H35372 Puckering of macula, left eye: Secondary | ICD-10-CM | POA: Diagnosis not present

## 2019-05-20 DIAGNOSIS — H43813 Vitreous degeneration, bilateral: Secondary | ICD-10-CM | POA: Diagnosis not present

## 2019-05-20 DIAGNOSIS — H353132 Nonexudative age-related macular degeneration, bilateral, intermediate dry stage: Secondary | ICD-10-CM | POA: Diagnosis not present

## 2019-06-06 ENCOUNTER — Telehealth: Payer: Self-pay | Admitting: Family Medicine

## 2019-06-06 NOTE — Telephone Encounter (Signed)
Pt has a new doctor dr Jonni Sanger and she needs generic paxil 10 mg #90 [facility]

## 2019-06-06 NOTE — Telephone Encounter (Signed)
See note

## 2019-06-07 ENCOUNTER — Other Ambulatory Visit: Payer: Self-pay

## 2019-06-07 DIAGNOSIS — F419 Anxiety disorder, unspecified: Secondary | ICD-10-CM

## 2019-06-07 MED ORDER — PAROXETINE HCL 10 MG PO TABS: 10.0000 mg | ORAL_TABLET | Freq: Every day | ORAL | 0 refills | Status: DC

## 2019-06-07 NOTE — Telephone Encounter (Signed)
Spoke with patient to let her know her prescription was sent to pharmacy.

## 2019-06-22 ENCOUNTER — Other Ambulatory Visit: Payer: Self-pay | Admitting: Family Medicine

## 2019-06-22 NOTE — Telephone Encounter (Signed)
Please request refill from her cardiologist who manages this medication.

## 2019-06-27 ENCOUNTER — Other Ambulatory Visit: Payer: Self-pay | Admitting: Family Medicine

## 2019-06-27 DIAGNOSIS — F419 Anxiety disorder, unspecified: Secondary | ICD-10-CM

## 2019-06-29 ENCOUNTER — Other Ambulatory Visit: Payer: Self-pay | Admitting: Family Medicine

## 2019-06-30 ENCOUNTER — Other Ambulatory Visit: Payer: Self-pay | Admitting: *Deleted

## 2019-06-30 ENCOUNTER — Encounter: Payer: Self-pay | Admitting: Family Medicine

## 2019-06-30 ENCOUNTER — Other Ambulatory Visit: Payer: Self-pay

## 2019-06-30 ENCOUNTER — Ambulatory Visit: Payer: Medicare Other | Admitting: Family Medicine

## 2019-06-30 ENCOUNTER — Ambulatory Visit (INDEPENDENT_AMBULATORY_CARE_PROVIDER_SITE_OTHER): Payer: Medicare Other | Admitting: Family Medicine

## 2019-06-30 VITALS — BP 132/70 | HR 72 | Temp 98.4°F | Resp 16 | Ht 69.0 in | Wt 157.4 lb

## 2019-06-30 DIAGNOSIS — I10 Essential (primary) hypertension: Secondary | ICD-10-CM | POA: Diagnosis not present

## 2019-06-30 DIAGNOSIS — F419 Anxiety disorder, unspecified: Secondary | ICD-10-CM | POA: Diagnosis not present

## 2019-06-30 DIAGNOSIS — E871 Hypo-osmolality and hyponatremia: Secondary | ICD-10-CM | POA: Diagnosis not present

## 2019-06-30 DIAGNOSIS — Z79899 Other long term (current) drug therapy: Secondary | ICD-10-CM

## 2019-06-30 DIAGNOSIS — J431 Panlobular emphysema: Secondary | ICD-10-CM | POA: Diagnosis not present

## 2019-06-30 DIAGNOSIS — E559 Vitamin D deficiency, unspecified: Secondary | ICD-10-CM | POA: Diagnosis not present

## 2019-06-30 DIAGNOSIS — E890 Postprocedural hypothyroidism: Secondary | ICD-10-CM | POA: Diagnosis not present

## 2019-06-30 DIAGNOSIS — I480 Paroxysmal atrial fibrillation: Secondary | ICD-10-CM

## 2019-06-30 DIAGNOSIS — E782 Mixed hyperlipidemia: Secondary | ICD-10-CM | POA: Diagnosis not present

## 2019-06-30 DIAGNOSIS — M810 Age-related osteoporosis without current pathological fracture: Secondary | ICD-10-CM | POA: Diagnosis not present

## 2019-06-30 LAB — BASIC METABOLIC PANEL
BUN: 20 mg/dL (ref 6–23)
CO2: 32 mEq/L (ref 19–32)
Calcium: 9 mg/dL (ref 8.4–10.5)
Chloride: 98 mEq/L (ref 96–112)
Creatinine, Ser: 1.32 mg/dL — ABNORMAL HIGH (ref 0.40–1.20)
GFR: 38.22 mL/min — ABNORMAL LOW (ref >60.00)
Glucose, Bld: 98 mg/dL (ref 70–99)
Potassium: 4.4 mEq/L (ref 3.5–5.1)
Sodium: 137 mEq/L (ref 135–145)

## 2019-06-30 MED ORDER — AMIODARONE HCL 200 MG PO TABS: 100.0000 mg | ORAL_TABLET | Freq: Every day | ORAL | 2 refills | Status: DC

## 2019-06-30 NOTE — Patient Instructions (Signed)
Please return in 12 months for your annual complete physical; please come fasting.  Next year we will try to get your bone density testing done again if you're willing.  I'll call you with your lab results.   If you have any questions or concerns, please don't hesitate to send me a message via MyChart or call the office at [phone]. Thank you for visiting with Tammy Harding today! It's our pleasure caring for you.

## 2019-06-30 NOTE — Progress Notes (Signed)
Subjective  CC:  Chief Complaint  Patient presents with  . Hypertension    This morning BP reading at home 135 70    HPI: Tammy Harding is a 85 y.o. female who presents to the office today to address the problems listed above in the chief complaint.  Hypertension f/u: Control is good . Pt reports she is doing well. taking medications as instructed, no medication side effects noted, no TIAs, no chest pain on exertion, no dyspnea on exertion, no swelling of ankles. She denies adverse effects from his BP medications. Compliance with medication is good.   H/o PAF: no palpitations or cp  Copd: some SOB due to heat and humidity. No cough, fever, chills sweats or DOE  Hyponatremic by January's labs, she feels well with no lower extremity edema or mental status changes.  No nausea.  Due for recheck.  Hypothyroidism has been well controlled.  Normal energy levels.  Anxiety is well controlled.  Has chronic benzos for nightly use.  Understands risk versus benefits  History of osteoporosis, not currently treated.  Patient declines current repeat bone density testing.  We did again discuss possible need for treatment including Prolia due to her Fosamax intolerance.  This could prevent bone fractures in the future.  She would like to defer for now  Assessment  1. Essential hypertension   2. Postoperative hypothyroidism   3. Long term prescription benzodiazepine use   4. Hyponatremia   5. Chronic anxiety   6. Age-related osteoporosis without current pathological fracture   7. Panlobular emphysema ([place])   8. Mixed hyperlipidemia   9. Paroxysmal atrial fibrillation (HCC)   10. Vitamin D deficiency      Plan    Hypertension f/u: BP control is well controlled.  Continue current medications  Hypothyroidism and anxiety f/u: Both well controlled  Recheck BMP for sodium levels and check random urine sodium and osmolality.  Further work-up pending results  Counseling education given  regarding osteoporosis follow-up studies and possible treatments.  Defer until next year.  Patient will think about it.  Vitamin D level was normal on last check Education regarding management of these chronic disease states was given. Management strategies discussed on successive visits include dietary and exercise recommendations, goals of achieving and maintaining IBW, and lifestyle modifications aiming for adequate sleep and minimizing stressors.   Follow up: Return in about 6 months (around 12/31/2019) for complete physical.  Orders Placed This Encounter  Procedures  . Basic metabolic panel  . Sodium, urine, random  . Osmolality, urine   No orders of the defined types were placed in this encounter.     BP Readings from Last 3 Encounters:  06/30/19 132/70  12/30/18 (!) 144/94  12/13/18 126/74   Wt Readings from Last 3 Encounters:  06/30/19 157 lb 6.4 oz (71.4 kg)  12/30/18 157 lb 3.2 oz (71.3 kg)  12/13/18 156 lb 12.8 oz (71.1 kg)    Lab Results  Component Value Date   CHOL 204 (H) 12/30/2018   Lab Results  Component Value Date   HDL 61.00 12/30/2018   Lab Results  Component Value Date   LDLCALC 127 (H) 12/30/2018   Lab Results  Component Value Date   TRIG 77.0 12/30/2018   Lab Results  Component Value Date   CHOLHDL 3 12/30/2018   No results found for: LDLDIRECT Lab Results  Component Value Date   CREATININE 1.08 12/30/2018   BUN 13 12/30/2018   NA 129 (L) 12/30/2018  K 4.3 12/30/2018   CL 92 (L) 12/30/2018   CO2 32 12/30/2018   Lab Results  Component Value Date   TSH 0.67 12/30/2018    The ASCVD Risk score Mikey Bussing DC Jr., et al., 2013) failed to calculate for the following reasons:   The 2013 ASCVD risk score is only valid for ages 70 to 39  I reviewed the patients updated PMH, FH, and SocHx.    Patient Active Problem List   Diagnosis Date Noted  . Long term prescription benzodiazepine use 10/12/2018  . Postoperative hypothyroidism 08/19/2018   . History of thyroid cancer 08/19/2018  . Chronic anxiety 08/19/2018  . Lung nodule 09/02/2016  . Paroxysmal atrial fibrillation ([place]) 08/30/2015  . Mixed hyperlipidemia 02/27/2015  . Essential hypertension 08/29/2014  . Osteoporosis 04/03/2014  . Vitamin D deficiency 06/24/2012  . Insomnia 08/13/2010  . IBS (irritable bowel syndrome) 08/07/2009  . COPD (chronic obstructive pulmonary disease) ([place]) 05/22/2009  . Gout 12/21/2008  . Allergic rhinitis due to pollen 03/27/2005  . Esophageal reflux 05/03/2004  . Generalized anxiety disorder 04/23/1999    Allergies: Penicillins, Amlodipine, Bee venom, Hctz [hydrochlorothiazide], Horse epithelium, Hydralazine, Lisinopril, and Losartan  Social History: Patient  reports that she has never smoked. She has never used smokeless tobacco. She reports that she does not drink alcohol or use drugs.  Current Meds  Medication Sig  . ALPRAZolam (XANAX) 0.5 MG tablet TAKE 1/2 TABLET BY MOUTH AT BEDTIME AS NEEDED FOR ANXIETY  . amiodarone (PACERONE) 200 MG tablet TAKE 1/2 TABLET BY MOUTH DAILY  . aspirin 81 MG chewable tablet Chew 162 mg by mouth daily.   . carvedilol (COREG) 12.5 MG tablet Take 12.5 mg by mouth 2 (two) times daily.  . cholecalciferol (VITAMIN D3) 25 MCG (1000 UT) tablet Take 1,000 Units by mouth daily.  . cloNIDine (CATAPRES) 0.1 MG tablet Take 0.1 mg by mouth 3 (three) times daily.   . hydrALAZINE (APRESOLINE) 25 MG tablet Take 25 mg by mouth 3 (three) times daily.   . Lactobacillus-Inulin ([place]) CAPS Take 1 capsule by mouth daily.  Marland Kitchen levothyroxine (SYNTHROID, LEVOTHROID) 150 MCG tablet TAKE 1 TABLET BY MOUTH EVERY DAY  . PARoxetine (PAXIL) 10 MG tablet TAKE 1 TABLET BY MOUTH EVERY DAY  . [DISCONTINUED] umeclidinium-vilanterol (ANORO ELLIPTA) 62.5-25 MCG/INH AEPB Inhale 1 puff into the lungs daily.    Review of Systems: Cardiovascular: negative for chest pain, palpitations, leg swelling, orthopnea  Respiratory: negative for SOB, wheezing or persistent cough Gastrointestinal: negative for abdominal pain Genitourinary: negative for dysuria or gross hematuria  Objective  Vitals: BP 132/70   Pulse 72   Temp 98.4 F (36.9 C) (Oral)   Resp 16   Ht 5\' 9"  (1.753 m)   Wt 157 lb 6.4 oz (71.4 kg)   SpO2 96%   BMI 23.24 kg/m  General: no acute distress  Psych:  Alert and oriented, normal mood and affect HEENT:  Normocephalic, atraumatic, supple neck  Cardiovascular:  RRR without murmur. no edema Respiratory:  Good breath sounds bilaterally, CTAB with normal respiratory effort Skin:  Warm, no rashes Neurologic:   Mental status is normal  Commons side effects, risks, benefits, and alternatives for medications and treatment plan prescribed today were discussed, and the patient expressed understanding of the given instructions. Patient is instructed to call or message via MyChart if he/she has any questions or concerns regarding our treatment plan. No barriers to understanding were identified. We discussed Red Flag symptoms and signs in detail.  Patient expressed understanding regarding what to do in case of urgent or emergency type symptoms.   Medication list was reconciled, printed and provided to the patient in AVS. Patient instructions and summary information was reviewed with the patient as documented in the AVS. This note was prepared with assistance of Dragon voice recognition software. Occasional wrong-word or sound-a-like substitutions may have occurred due to the inherent limitations of voice recognition software

## 2019-07-01 LAB — OSMOLALITY, URINE

## 2019-07-01 LAB — SODIUM, URINE, RANDOM: Sodium, Ur: 26 mmol/L — ABNORMAL LOW (ref 28–272)

## 2019-07-01 NOTE — Progress Notes (Signed)
Please call patient: I have reviewed his/her lab results. Sodium has normalized.

## 2019-07-19 ENCOUNTER — Telehealth: Payer: Self-pay

## 2019-07-19 ENCOUNTER — Other Ambulatory Visit: Payer: Self-pay

## 2019-07-19 ENCOUNTER — Encounter: Payer: Self-pay | Admitting: Family Medicine

## 2019-07-19 ENCOUNTER — Ambulatory Visit (INDEPENDENT_AMBULATORY_CARE_PROVIDER_SITE_OTHER): Payer: Medicare Other | Admitting: Family Medicine

## 2019-07-19 VITALS — BP 138/86 | HR 67 | Temp 98.0°F | Ht 69.0 in | Wt 157.6 lb

## 2019-07-19 DIAGNOSIS — K5900 Constipation, unspecified: Secondary | ICD-10-CM | POA: Diagnosis not present

## 2019-07-19 DIAGNOSIS — R1013 Epigastric pain: Secondary | ICD-10-CM

## 2019-07-19 MED ORDER — DICLOFENAC SODIUM 50 MG PO TBEC: 50.0000 mg | DELAYED_RELEASE_TABLET | Freq: Two times a day (BID) | ORAL | 0 refills | Status: DC

## 2019-07-19 MED ORDER — PANTOPRAZOLE SODIUM 40 MG PO TBEC: 40.0000 mg | DELAYED_RELEASE_TABLET | Freq: Every day | ORAL | 0 refills | Status: DC

## 2019-07-19 NOTE — Telephone Encounter (Signed)
Received an incoming triage call from patient.  She started with c/o diffuse epigastric pain x 5 days.  She denies any F/C/C or N/V/D, chest pain, SOB or muscle pain.  She does admit to feeling slightly weak and being constipated.  She reports bending over while gardening several days ago and thinks maybe she may have pulled a muscle.  She did take a 1/2 of a Tylenol yesterday which gave her minimal relief.   Appt was offered and accepted with Dr. Jerline Pain today 8/18 @ 1:40 pm.  Will route to Dr. Jerline Pain for review prior to her appt.

## 2019-07-19 NOTE — Progress Notes (Signed)
   Chief Complaint:  Tammy Harding is a 85 y.o. female who presents for same day appointment with a chief complaint of abdominal pain.   Assessment/Plan:  Epigastric Abdominal Pain No red flags.  Benign abdominal exam.  She had some improvement with Mylanta here in the office.  Will start Protonix 40 mg daily to treat any possible underlying PUD or gastritis/esophagitis.  Also start low-dose diclofenac for any possible muscular component.  She is also having some constipation that is chronic, though could also be playing a role.  Encouraged her to start MiraLAX as needed to have a least one soft bowel movement daily.  Discussed reasons to return to care.  Follow-up as needed.     Subjective:  HPI:  Abdominal Pain, acute problem Started 5 days ago.  Patient was in the garden pulling when she first noticed the pain.  Is located in her upper abdominal area just above her bellybutton.  Pain is been persistent for the past 5 days.  Is much worse at night.  She has tried taking Tylenol which is helped a little bit.  Her appetite is been a little bit decreased.  Symptoms do not improve or worsen with eating.  She has had some constipation which is a chronic problem for her.  She is taking Colace and a probiotic for this which seems to help.  Does not seem to be more constipated than she was previously.  No other treatments tried.  No nausea or vomiting.  No melena or hematochezia. No other obvious alleviating or aggravating factors.   ROS: Per HPI  PMH: She reports that she has never smoked. She has never used smokeless tobacco. She reports that she does not drink alcohol or use drugs.      Objective:  Physical Exam: BP 138/86 (BP Location: Left Arm, Patient Position: Sitting, Cuff Size: Normal)   Pulse 67   Temp 98 F (36.7 C) (Oral)   Ht 5\' 9"  (1.753 m)   Wt 157 lb 9.6 oz (71.5 kg)   SpO2 98%   BMI 23.27 kg/m   Gen: NAD, resting comfortably GI: Normal bowel sounds.  No deformities.   Mildly tender to palpation along epigastric area.  No rebound or guarding. MSK: No edema, cyanosis, or clubbing noted Skin: Warm, dry Neuro: Grossly normal, moves all extremities Psych: Normal affect and thought content       M. Jerline Pain, MD 07/19/2019 1:49 PM

## 2019-07-19 NOTE — Patient Instructions (Signed)
It was very nice to see you today!  Please take diclofenac and Protonix.  Please try the MiraLAX for your bowels.  It is okay for you to take prune juice.  Please stay well-hydrated.  Let me know or let Dr. Jonni Sanger know if your symptoms worsen or do not improve in the next few days.  Take care, Dr Jerline Pain

## 2019-08-02 ENCOUNTER — Encounter: Payer: Self-pay | Admitting: Physician Assistant

## 2019-08-02 ENCOUNTER — Ambulatory Visit (INDEPENDENT_AMBULATORY_CARE_PROVIDER_SITE_OTHER): Payer: Medicare Other

## 2019-08-02 ENCOUNTER — Ambulatory Visit (INDEPENDENT_AMBULATORY_CARE_PROVIDER_SITE_OTHER): Payer: Medicare Other | Admitting: Physician Assistant

## 2019-08-02 ENCOUNTER — Other Ambulatory Visit: Payer: Self-pay

## 2019-08-02 VITALS — BP 142/80 | HR 64 | Temp 98.5°F | Resp 16 | Ht 69.0 in | Wt 156.6 lb

## 2019-08-02 DIAGNOSIS — R8290 Unspecified abnormal findings in urine: Secondary | ICD-10-CM | POA: Diagnosis not present

## 2019-08-02 DIAGNOSIS — R350 Frequency of micturition: Secondary | ICD-10-CM | POA: Diagnosis not present

## 2019-08-02 DIAGNOSIS — R1030 Lower abdominal pain, unspecified: Secondary | ICD-10-CM

## 2019-08-02 DIAGNOSIS — R399 Unspecified symptoms and signs involving the genitourinary system: Secondary | ICD-10-CM | POA: Diagnosis not present

## 2019-08-02 LAB — POCT URINALYSIS DIPSTICK
Bilirubin, UA: POSITIVE
Blood, UA: NEGATIVE
Glucose, UA: NEGATIVE
Ketones, UA: POSITIVE
Leukocytes, UA: NEGATIVE
Nitrite, UA: NEGATIVE
Protein, UA: NEGATIVE
Spec Grav, UA: 1.02 (ref 1.010–1.025)
Urobilinogen, UA: 0.2 E.U./dL
pH, UA: 6 (ref 5.0–8.0)

## 2019-08-02 NOTE — Progress Notes (Signed)
Tammy Harding is a 85 y.o. female here for a new problem.  History of Present Illness:   Chief Complaint  Patient presents with  . Urinary Tract Infection    Reports having abdominal pain, urinary frequency, & foul smelling urine.. Denies Dysuria and fever, states that since starting diclofenac on 8/18 her sxs worsen    HPI   Abdominal pain Was recently seen by Dr. Dimas Chyle in our office for epigastric abdominal pain and constipation.  She was told at that time to start MiraLAX, she picked this up but did not start it.  She was also started on Protonix and diclofenac.  She states that diclofenac made her's abdomen swell, and she did not take it more than a handful of times.  She is tolerating the Protonix.  She has not had a bowel movement in 3 days.  She is also having urinary frequency and foul-smelling urine.  Denies: Dysuria, fever, chills, blood in urine, blood in stool, saddle anesthesia.  She is passing stool without difficulty.   Past Medical History:  Diagnosis Date  . Allergy   . Anxiety   . Arthritis   . Chronic anxiety 08/19/2018  . Chronic atrial fibrillation 08/19/2018  . Depression   . Essential hypertension 08/19/2018  . Thyroid cancer [facility])      Social History   Socioeconomic History  . Marital status: Widowed    Spouse name: Not on file  . Number of children: 1  . Years of education: Not on file  . Highest education level: Not on file  Occupational History  . Not on file  Social Needs  . Financial resource strain: Not on file  . Food insecurity    Worry: Not on file    Inability: Not on file  . Transportation needs    Medical: Not on file    Non-medical: Not on file  Tobacco Use  . Smoking status: Never Smoker  . Smokeless tobacco: Never Used  Substance and Sexual Activity  . Alcohol use: Never    Frequency: Never  . Drug use: Never  . Sexual activity: Not Currently  Lifestyle  . Physical activity    Days per week: Not on file    Minutes per  session: Not on file  . Stress: Not on file  Relationships  . Social Herbalist on phone: Not on file    Gets together: Not on file    Attends religious service: Not on file    Active member of club or organization: Not on file    Attends meetings of clubs or organizations: Not on file    Relationship status: Not on file  . Intimate partner violence    Fear of current or ex partner: Not on file    Emotionally abused: Not on file    Physically abused: Not on file    Forced sexual activity: Not on file  Other Topics Concern  . Not on file  Social History Narrative  . Not on file    Past Surgical History:  Procedure Laterality Date  . APPENDECTOMY    . THYROIDECTOMY     1979  . TONSILLECTOMY AND ADENOIDECTOMY      Family History  Problem Relation Age of Onset  . Healthy Son   . Arthritis Mother   . Hypertension Mother   . Kidney disease Mother   . Arthritis Father   . Heart attack Father   . Hypertension Sister   . Depression  Sister   . Hypertension Sister     Allergies  Allergen Reactions  . Penicillins Swelling  . Amlodipine Swelling  . Bee Venom   . Hctz [Hydrochlorothiazide] Swelling  . Horse Epithelium   . Hydralazine     Postural hypotension   . Lisinopril   . Losartan     Current Medications:   Current Outpatient Medications:  .  ALPRAZolam (XANAX) 0.5 MG tablet, TAKE 1/2 TABLET BY MOUTH AT BEDTIME AS NEEDED FOR ANXIETY, Disp: 45 tablet, Rfl: 1 .  amiodarone (PACERONE) 200 MG tablet, Take 0.5 tablets (100 mg total) by mouth daily., Disp: 30 tablet, Rfl: 2 .  aspirin 81 MG chewable tablet, Chew 162 mg by mouth daily. , Disp: , Rfl:  .  carvedilol (COREG) 12.5 MG tablet, Take 12.5 mg by mouth 2 (two) times daily., Disp: , Rfl: 2 .  cholecalciferol (VITAMIN D3) 25 MCG (1000 UT) tablet, Take 1,000 Units by mouth daily., Disp: , Rfl:  .  cloNIDine (CATAPRES) 0.1 MG tablet, Take 0.1 mg by mouth 3 (three) times daily. , Disp: , Rfl:  .   hydrALAZINE (APRESOLINE) 25 MG tablet, Take 25 mg by mouth 3 (three) times daily. , Disp: , Rfl:  .  Lactobacillus-Inulin ([place]) CAPS, Take 1 capsule by mouth daily., Disp: , Rfl:  .  levothyroxine (SYNTHROID, LEVOTHROID) 150 MCG tablet, TAKE 1 TABLET BY MOUTH EVERY DAY, Disp: 90 tablet, Rfl: 2 .  PARoxetine (PAXIL) 10 MG tablet, TAKE 1 TABLET BY MOUTH EVERY DAY, Disp: 90 tablet, Rfl: 0 .  diclofenac (VOLTAREN) 50 MG EC tablet, Take 1 tablet (50 mg total) by mouth 2 (two) times daily., Disp: 30 tablet, Rfl: 0 .  pantoprazole (PROTONIX) 40 MG tablet, Take 1 tablet (40 mg total) by mouth daily., Disp: 30 tablet, Rfl: 0   Review of Systems:   ROS  Negative unless otherwise specified per HPI.  Vitals:   Vitals:   08/02/19 1447  BP: (!) 142/80  Pulse: 64  Resp: 16  Temp: 98.5 F (36.9 C)  TempSrc: Tympanic  SpO2: 97%  Weight: 156 lb 9.6 oz (71 kg)  Height: 5\' 9"  (1.753 m)     Body mass index is 23.13 kg/m.  Physical Exam:   Physical Exam Vitals signs and nursing note reviewed.  Constitutional:      General: She is not in acute distress.    Appearance: She is well-developed. She is not ill-appearing or toxic-appearing.  Cardiovascular:     Rate and Rhythm: Normal rate and regular rhythm.     Pulses: Normal pulses.     Heart sounds: Normal heart sounds, S1 normal and S2 normal.     Comments: No LE edema Pulmonary:     Effort: Pulmonary effort is normal.     Breath sounds: Normal breath sounds.  Abdominal:     General: Abdomen is flat. Bowel sounds are normal.     Palpations: Abdomen is soft.     Tenderness: There is abdominal tenderness. There is no right CVA tenderness, left CVA tenderness, guarding or rebound.  Skin:    General: Skin is warm and dry.  Neurological:     Mental Status: She is alert.     GCS: GCS eye subscore is 4. GCS verbal subscore is 5. GCS motor subscore is 6.  Psychiatric:        Speech: Speech normal.        Behavior:  Behavior normal. Behavior is cooperative.  Results for orders placed or performed in visit on 08/02/19  POCT urinalysis dipstick  Result Value Ref Range   Color, UA Yellow    Clarity, UA Clear    Glucose, UA Negative Negative   Bilirubin, UA Positive    Ketones, UA Positive    Spec Grav, UA 1.020 1.010 - 1.025   Blood, UA Negative    pH, UA 6.0 5.0 - 8.0   Protein, UA Negative Negative   Urobilinogen, UA 0.2 0.2 or 1.0 E.U./dL   Nitrite, UA Negative    Leukocytes, UA Negative Negative   Appearance     Odor      Assessment and Plan:   Sotheary was seen today for urinary tract infection.  Diagnoses and all orders for this visit:  Urinary frequency; Foul smelling urine Urine is essentially normal, however I do suspect some dehydration.  Will send off for culture for further evaluation.  I do think that her urinary frequency could be due to her constipation.  We discussed this and worsening precautions were advised. -     POCT urinalysis dipstick -     Urine Culture  Lower abdominal pain KUB with moderate stool burden?  Awaiting official radiology read.  I recommend that she stop the diclofenac since this is not making her symptoms better, and is actually making them worse.  May continue the Protonix.  I discussed with her to start MiraLAX and Colace daily, with goal to have 1 bowel movement daily.  I also gave her handout on better foods to promote improved bowel health.  Worsening precautions advised.  No red flags on my exam today. -     POCT urinalysis dipstick -     Urine Culture -     DG Abd 1 View; Future  . Reviewed expectations re: course of current medical issues. . Discussed self-management of symptoms. . Outlined signs and symptoms indicating need for more acute intervention. . Patient verbalized understanding and all questions were answered. . See orders for this visit as documented in the electronic medical record. . Patient received an After-Visit  Summary.  CMA or LPN served as scribe during this visit. History, Physical, and Plan performed by medical provider. The above documentation has been reviewed and is accurate and complete.   Inda Coke, PA-C

## 2019-08-02 NOTE — Patient Instructions (Signed)
It was great to see you!  I would like for you start daily miralax. Mix one serving in beverage of your choice. I would also like for you to start daily colace (also available over the counter) to see if this will help soften things for you. Try this regimen until you start having regular, daily bowel movements.  I will be in touch with your xray and urine culture results.  If you have any severe abdominal pain, please go to the emergency room.  Stop the diclofenac. May continue the protonix.  Take care,  Inda Coke PA-C

## 2019-08-04 LAB — URINE CULTURE
MICRO NUMBER:: 835691
Result:: NO GROWTH
SPECIMEN QUALITY:: ADEQUATE

## 2019-08-10 ENCOUNTER — Other Ambulatory Visit: Payer: Self-pay | Admitting: Family Medicine

## 2019-08-28 ENCOUNTER — Other Ambulatory Visit: Payer: Self-pay | Admitting: Family Medicine

## 2019-09-07 ENCOUNTER — Other Ambulatory Visit: Payer: Self-pay | Admitting: *Deleted

## 2019-09-07 MED ORDER — CARVEDILOL 12.5 MG PO TABS: 12.5000 mg | ORAL_TABLET | Freq: Two times a day (BID) | ORAL | 3 refills | Status: DC

## 2019-09-11 ENCOUNTER — Other Ambulatory Visit: Payer: Self-pay | Admitting: Family Medicine

## 2019-09-21 ENCOUNTER — Other Ambulatory Visit: Payer: Self-pay | Admitting: Family Medicine

## 2019-09-22 ENCOUNTER — Other Ambulatory Visit: Payer: Self-pay

## 2019-09-22 ENCOUNTER — Encounter: Payer: Self-pay | Admitting: Family Medicine

## 2019-09-22 ENCOUNTER — Ambulatory Visit (INDEPENDENT_AMBULATORY_CARE_PROVIDER_SITE_OTHER): Payer: Medicare Other

## 2019-09-22 DIAGNOSIS — Z23 Encounter for immunization: Secondary | ICD-10-CM | POA: Diagnosis not present

## 2019-10-04 ENCOUNTER — Other Ambulatory Visit: Payer: Self-pay | Admitting: *Deleted

## 2019-10-04 MED ORDER — ALPRAZOLAM 0.5 MG PO TABS: ORAL_TABLET | ORAL | 2 refills | Status: DC

## 2019-10-14 ENCOUNTER — Telehealth: Payer: Self-pay | Admitting: *Deleted

## 2019-10-14 NOTE — Telephone Encounter (Signed)
Copied from [place] [id number]. Topic: General - Inquiry >> Oct 14, 2019  2:23 PM Alease Frame wrote: Reason for CRM: Patient is wanting a call back from Dr. Jonni Sanger as soon as possible

## 2019-10-14 NOTE — Telephone Encounter (Signed)
Spoke to patient.  She is c/o persistent abdominal pain/urinary frequency.  She was seen on 11/18 by Dr. Jerline Pain and again on 9/1 by Inda Coke.  She was prescribed pantoprazole by Dr. Jerline Pain on 8/18 and feels that this medication has been causing her abdominal pain to be worse and also she thinks this medication is causing her to have back pain.    She insists on seeing Dr. Jonni Sanger to address this issue.  Appointment scheduled for in office visit on 11/17 w/Dr. Jonni Sanger.  Patient verbalized understanding.

## 2019-10-14 NOTE — Telephone Encounter (Signed)
Please call to get more information

## 2019-10-18 ENCOUNTER — Encounter: Payer: Self-pay | Admitting: Family Medicine

## 2019-10-18 ENCOUNTER — Ambulatory Visit (INDEPENDENT_AMBULATORY_CARE_PROVIDER_SITE_OTHER): Payer: Medicare Other | Admitting: Family Medicine

## 2019-10-18 ENCOUNTER — Other Ambulatory Visit: Payer: Self-pay

## 2019-10-18 VITALS — BP 136/86 | HR 68 | Temp 98.2°F | Ht 69.0 in | Wt 153.0 lb

## 2019-10-18 DIAGNOSIS — K581 Irritable bowel syndrome with constipation: Secondary | ICD-10-CM

## 2019-10-18 DIAGNOSIS — J431 Panlobular emphysema: Secondary | ICD-10-CM

## 2019-10-18 DIAGNOSIS — R140 Abdominal distension (gaseous): Secondary | ICD-10-CM

## 2019-10-18 DIAGNOSIS — R1012 Left upper quadrant pain: Secondary | ICD-10-CM

## 2019-10-18 DIAGNOSIS — R350 Frequency of micturition: Secondary | ICD-10-CM | POA: Diagnosis not present

## 2019-10-18 DIAGNOSIS — F411 Generalized anxiety disorder: Secondary | ICD-10-CM | POA: Diagnosis not present

## 2019-10-18 LAB — CBC WITH DIFFERENTIAL/PLATELET
Basophils Absolute: 0.1 10*3/uL (ref 0.0–0.1)
Basophils Relative: 1.3 % (ref 0.0–3.0)
Eosinophils Absolute: 0.1 10*3/uL (ref 0.0–0.7)
Eosinophils Relative: 2.6 % (ref 0.0–5.0)
HCT: 38.7 % (ref 36.0–46.0)
Hemoglobin: 12.7 g/dL (ref 12.0–15.0)
Lymphocytes Relative: 23.2 % (ref 12.0–46.0)
Lymphs Abs: 1 10*3/uL (ref 0.7–4.0)
MCHC: 32.9 g/dL (ref 30.0–36.0)
MCV: 83.5 fl (ref 78.0–100.0)
Monocytes Absolute: 0.5 10*3/uL (ref 0.1–1.0)
Monocytes Relative: 10.8 % (ref 3.0–12.0)
Neutro Abs: 2.6 10*3/uL (ref 1.4–7.7)
Neutrophils Relative %: 62.1 % (ref 43.0–77.0)
Platelets: 201 10*3/uL (ref 150.0–400.0)
RBC: 4.63 Mil/uL (ref 3.87–5.11)
RDW: 14.8 % (ref 11.5–15.5)
WBC: 4.2 10*3/uL (ref 4.0–10.5)

## 2019-10-18 LAB — COMPREHENSIVE METABOLIC PANEL
ALT: 9 U/L (ref 0–35)
AST: 13 U/L (ref 0–37)
Albumin: 4.1 g/dL (ref 3.5–5.2)
Alkaline Phosphatase: 73 U/L (ref 39–117)
BUN: 18 mg/dL (ref 6–23)
CO2: 31 mEq/L (ref 19–32)
Calcium: 9.1 mg/dL (ref 8.4–10.5)
Chloride: 95 mEq/L — ABNORMAL LOW (ref 96–112)
Creatinine, Ser: 1.22 mg/dL — ABNORMAL HIGH (ref 0.40–1.20)
GFR: 41.83 mL/min — ABNORMAL LOW (ref >60.00)
Glucose, Bld: 60 mg/dL — ABNORMAL LOW (ref 70–99)
Potassium: 4.5 mEq/L (ref 3.5–5.1)
Sodium: 137 mEq/L (ref 135–145)
Total Bilirubin: 0.4 mg/dL (ref 0.2–1.2)
Total Protein: 6.6 g/dL (ref 6.0–8.3)

## 2019-10-18 LAB — LIPASE: Lipase: 25 U/L (ref 11.0–59.0)

## 2019-10-18 LAB — TSH: TSH: 0.28 u[IU]/mL — ABNORMAL LOW (ref 0.35–4.50)

## 2019-10-18 LAB — SEDIMENTATION RATE: Sed Rate: 26 mm/hr (ref 0–30)

## 2019-10-18 MED ORDER — ATROVENT HFA 17 MCG/ACT IN AERS: 2.0000 | INHALATION_SPRAY | Freq: Two times a day (BID) | RESPIRATORY_TRACT | 11 refills | Status: DC

## 2019-10-18 MED ORDER — HYOSCYAMINE SULFATE SL 0.125 MG SL SUBL: 0.1250 mg | SUBLINGUAL_TABLET | Freq: Three times a day (TID) | SUBLINGUAL | 2 refills | Status: AC | PRN

## 2019-10-18 MED ORDER — ALBUTEROL SULFATE HFA 108 (90 BASE) MCG/ACT IN AERS: 2.0000 | INHALATION_SPRAY | RESPIRATORY_TRACT | 11 refills | Status: DC | PRN

## 2019-10-18 MED ORDER — BUSPIRONE HCL 7.5 MG PO TABS: 7.5000 mg | ORAL_TABLET | Freq: Three times a day (TID) | ORAL | 2 refills | Status: DC | PRN

## 2019-10-18 NOTE — Progress Notes (Signed)
Subjective  CC:  Chief Complaint  Patient presents with  . Abdominal Pain  . Gas    HPI: Tammy Harding is a 85 y.o. female who presents to the office today to address the problems listed above in the chief complaint.  85 yo here for persistent abdominal pain; was seen back in aug and sept for same. Describes "tons of gas" with cramping abd pain, was midepigastric but now is in LUQ. Pain is associated with gas and will last several minutes. sxs occur daily on and off and certain foods make it worse: lettuce, salads. Appetite is a bit down. No melena or diarrhea; has h/o IBS-C. Now using miralax some with fair results. Recent kub was unremarkable. No f/c/s, na/v. PPI was not helpful.   Increase stress: finds herself worrying more; staying home now which is lonely and isolating. Has chronic anxiety on benzo's - weaned down to just nightly now. She would like help with some daytime sxs: feels anxious,panicky. Denies depressive sxs.   COPD: couldn't afford anoro; feels DOE at times. No pain. No chronic wheezing.   ROS: negative for unwanted weight changes, melena, persistent localized abdominal pain or urinary sxs.   Assessment  1. Left upper quadrant abdominal pain   2. Urinary frequency   3. Irritable bowel syndrome with constipation   4. Generalized anxiety disorder   5. Panlobular emphysema ([place])   6. Bloating      Plan   Abdominal pain and bloating:  Suspect active IBS, in part due to increase stress/worry: trial of levsin, gas-x and miralax as needed. Education given. Check labs to r/o other causes. Benign abdomen   GAD: add buspar. Education given.   COPD: atrovent and albuterol as needed.    Follow up: Return in about 8 weeks (around 12/13/2019) for complete physical, recheck.  Visit date not found  Orders Placed This Encounter  Procedures  . CBC w/Diff  . CMP  . ESR  . TSH  . Lipase   Meds ordered this encounter  Medications  . busPIRone (BUSPAR) 7.5 MG tablet    Sig: Take 1 tablet (7.5 mg total) by mouth 3 (three) times daily as needed (anxiety).    Dispense:  90 tablet    Refill:  2  . albuterol (VENTOLIN HFA) 108 (90 Base) MCG/ACT inhaler    Sig: Inhale 2 puffs into the lungs every 4 (four) hours as needed for wheezing or shortness of breath.    Dispense:  18 g    Refill:  11  . ipratropium (ATROVENT HFA) 17 MCG/ACT inhaler    Sig: Inhale 2 puffs into the lungs 2 (two) times daily.    Dispense:  1 Inhaler    Refill:  11  . Hyoscyamine Sulfate SL (LEVSIN/SL) 0.125 MG SUBL    Sig: Place 0.125 mg under the tongue 3 (three) times daily with meals as needed (abdominal cramping or bloating).    Dispense:  60 tablet    Refill:  2      I reviewed the patients updated PMH, FH, and SocHx.    Patient Active Problem List   Diagnosis Date Noted  . Long term prescription benzodiazepine use 10/12/2018    Priority: High  . Postoperative hypothyroidism 08/19/2018    Priority: High  . History of thyroid cancer 08/19/2018    Priority: High  . Paroxysmal atrial fibrillation ([place]) 08/30/2015    Priority: High  . Mixed hyperlipidemia 02/27/2015    Priority: High  . Essential hypertension 08/29/2014  Priority: High  . Osteoporosis 04/03/2014    Priority: High  . Insomnia 08/13/2010    Priority: High  . COPD (chronic obstructive pulmonary disease) ([place]) 05/22/2009    Priority: High  . Generalized anxiety disorder 04/23/1999    Priority: High  . IBS (irritable bowel syndrome) 08/07/2009    Priority: Medium  . Gout 12/21/2008    Priority: Medium  . Esophageal reflux 05/03/2004    Priority: Medium  . Vitamin D deficiency 06/24/2012    Priority: Low  . Allergic rhinitis due to pollen 03/27/2005    Priority: Low  . Lung nodule 09/02/2016   Current Meds  Medication Sig  . ALPRAZolam (XANAX) 0.5 MG tablet TAKE 1/2 TABLET BY MOUTH AT BEDTIME AS NEEDED FOR ANXIETY  . amiodarone (PACERONE) 200 MG tablet Take 0.5 tablets (100 mg total) by mouth  daily.  Marland Kitchen aspirin 81 MG chewable tablet Chew 162 mg by mouth daily.   . carvedilol (COREG) 12.5 MG tablet Take 1 tablet (12.5 mg total) by mouth 2 (two) times daily.  . cholecalciferol (VITAMIN D3) 25 MCG (1000 UT) tablet Take 1,000 Units by mouth daily.  . cloNIDine (CATAPRES) 0.1 MG tablet TAKE 1 TABLET BY MOUTH THREE TIMES A DAY  . hydrALAZINE (APRESOLINE) 25 MG tablet Take 25 mg by mouth 3 (three) times daily.   . Lactobacillus-Inulin ([place]) CAPS Take 1 capsule by mouth daily.  Marland Kitchen levothyroxine (SYNTHROID) 150 MCG tablet TAKE 1 TABLET BY MOUTH EVERY DAY  . PARoxetine (PAXIL) 10 MG tablet TAKE 1 TABLET BY MOUTH EVERY DAY    Allergies: Patient is allergic to penicillins; amlodipine; bee venom; hctz [hydrochlorothiazide]; horse epithelium; hydralazine; lisinopril; and losartan. Family History: Patient family history includes Arthritis in her father and mother; Depression in her sister; Healthy in her son; Heart attack in her father; Hypertension in her mother, sister, and sister; Kidney disease in her mother. Social History:  Patient  reports that she has never smoked. She has never used smokeless tobacco. She reports that she does not drink alcohol or use drugs.  Review of Systems: Constitutional: Negative for fever malaise or anorexia Cardiovascular: negative for chest pain Respiratory: negative for SOB or persistent cough Gastrointestinal: negative for abdominal pain  Objective  Vitals: BP 136/86 (BP Location: Left Arm, Patient Position: Sitting, Cuff Size: Normal)   Pulse 68   Temp 98.2 F (36.8 C) (Temporal)   Ht '5\' 9"'$  (1.753 m)   Wt 153 lb (69.4 kg)   SpO2 96%   BMI 22.59 kg/m  General: no acute distress , A&Ox3 HEENT: PEERL, conjunctiva normal, Oropharynx moist,neck is supple Cardiovascular:  irreg irreg, no peripheral edema Respiratory:  Good breath sounds bilaterally, CTAB with normal respiratory effort Gastrointestinal: soft, flat abdomen, normal  active bowel sounds, no palpable masses, no hepatosplenomegaly, no appreciated hernias, mininal LUQ ttp w/o rebound or guarding or mass Neuro: no tremor Skin:  Warm, no rashes     Commons side effects, risks, benefits, and alternatives for medications and treatment plan prescribed today were discussed, and the patient expressed understanding of the given instructions. Patient is instructed to call or message via MyChart if he/she has any questions or concerns regarding our treatment plan. No barriers to understanding were identified. We discussed Red Flag symptoms and signs in detail. Patient expressed understanding regarding what to do in case of urgent or emergency type symptoms.   Medication list was reconciled, printed and provided to the patient in AVS. Patient instructions and summary information was  reviewed with the patient as documented in the AVS. This note was prepared with assistance of Dragon voice recognition software. Occasional wrong-word or sound-a-like substitutions may have occurred due to the inherent limitations of voice recognition software

## 2019-10-18 NOTE — Patient Instructions (Addendum)
Please return in 8 weeks for recheck and your annual complete physical; please come fasting.    I think your irritable bowel is active; start OTC Gas-X to help with gas pain relief. You may also try the levsin sublingual tablet (beneath the tongue tablet) to see if that helps as well. Continue the miralax as needed for soft bowel movements.   Start buspar 2-3x/ day as needed for anxiety symptoms.   I have ordered two inhalers for you: atrovent twice a day; and the albuterol to use as needed for wheezing or shortness of breath. They both are generic and should be affordable.   If you have any questions or concerns, please don't hesitate to send me a message via MyChart or call the office at [phone]. Thank you for visiting with [place] today! It's our pleasure caring for you.

## 2019-10-19 NOTE — Progress Notes (Signed)
Please call patient: I have reviewed his/her lab results. Lab results look good. No concern for other things causing her symptoms at this time. However, her thyroid is just a little high; need to recheck. Please schedule patient for a f/u visit on abd pain and thyroid recheck in 3-4 weeks.  Also she was to schedule her CPE visit and AWV for January or February.  Thanks.

## 2019-11-09 ENCOUNTER — Other Ambulatory Visit: Payer: Self-pay | Admitting: Family Medicine

## 2019-11-09 NOTE — Telephone Encounter (Signed)
  LAST APPOINTMENT DATE: 10/18/19  NEXT APPOINTMENT DATE: Visit date not found  MEDICATION: Buspirone HCL 7.5 mg Tabs  PHARMACY: [facility] [address]  **Let patient know to contact pharmacy at the end of the day to make sure medication is ready. **  ** Please notify patient to allow 48-72 hours to process**  **Encourage patient to contact the pharmacy for refills or they can request refills through [facility]**  [place]:   LAST REFILL: 10/18/2019  QTY: 90 (Patient is requesting a 90 day Rx)  REFILLS: 2    OTHER COMMENTS:    Okay for refill?  Please advise

## 2019-11-29 ENCOUNTER — Other Ambulatory Visit: Payer: Self-pay | Admitting: Family Medicine

## 2019-11-29 DIAGNOSIS — F419 Anxiety disorder, unspecified: Secondary | ICD-10-CM

## 2019-12-04 ENCOUNTER — Other Ambulatory Visit: Payer: Self-pay | Admitting: Family Medicine

## 2019-12-13 ENCOUNTER — Other Ambulatory Visit: Payer: Self-pay | Admitting: Family Medicine

## 2019-12-14 ENCOUNTER — Other Ambulatory Visit: Payer: Self-pay | Admitting: Family Medicine

## 2019-12-16 ENCOUNTER — Other Ambulatory Visit: Payer: Self-pay

## 2019-12-16 ENCOUNTER — Ambulatory Visit (INDEPENDENT_AMBULATORY_CARE_PROVIDER_SITE_OTHER): Payer: Medicare Other

## 2019-12-16 DIAGNOSIS — Z0000 Encounter for general adult medical examination without abnormal findings: Secondary | ICD-10-CM

## 2019-12-16 NOTE — Patient Instructions (Signed)
Tammy Harding , Thank you for taking time to come for your Medicare Wellness Visit. I appreciate your ongoing commitment to your health goals. Please review the following plan we discussed and let me know if I can assist you in the future.   Screening recommendations/referrals: Colorectal Screening: No longer indicated  Mammogram: No longer indicated Bone Density: On hold   Vision and Dental Exams: Recommended annual ophthalmology exams for early detection of glaucoma and other disorders of the eye Recommended annual dental exams for proper oral hygiene  Vaccinations: Influenza vaccine: completed 09/22/19 Pneumococcal vaccine: up to date  Tdap vaccine: up to date Shingles vaccine: Please call your insurance company to determine your out of pocket expense for the Shingrix vaccine. You may receive this vaccine at your local pharmacy.  Advanced directives: Please bring a copy of your POA (Power of Attorney) and/or Living Will to your next appointment.  Goals: Recommend to drink at least 6-8 8oz glasses of water per day and consume a balanced diet rich in fresh fruits and vegetables.   Next appointment: Please schedule your Annual Wellness Visit with your Nurse Health Advisor in one year.  Preventive Care 84 Years and Older, Female Preventive care refers to lifestyle choices and visits with your health care provider that can promote health and wellness. What does preventive care include?  A yearly physical exam. This is also called an annual well check.  Dental exams once or twice a year.  Routine eye exams. Ask your health care provider how often you should have your eyes checked.  Personal lifestyle choices, including:  Daily care of your teeth and gums.  Regular physical activity.  Eating a healthy diet.  Avoiding tobacco and drug use.  Limiting alcohol use.  Practicing safe sex.  Taking low-dose aspirin every day if recommended by your health care provider.  Taking  vitamin and mineral supplements as recommended by your health care provider. What happens during an annual well check? The services and screenings done by your health care provider during your annual well check will depend on your age, overall health, lifestyle risk factors, and family history of disease. Counseling  Your health care provider may ask you questions about your:  Alcohol use.  Tobacco use.  Drug use.  Emotional well-being.  Home and relationship well-being.  Sexual activity.  Eating habits.  History of falls.  Memory and ability to understand (cognition).  Work and work Statistician.  Reproductive health. Screening  You may have the following tests or measurements:  Height, weight, and BMI.  Blood pressure.  Lipid and cholesterol levels. These may be checked every 5 years, or more frequently if you are over 60 years old.  Skin check.  Lung cancer screening. You may have this screening every year starting at age 84 if you have a 30-pack-year history of smoking and currently smoke or have quit within the past 15 years.  Fecal occult blood test (FOBT) of the stool. You may have this test every year starting at age 84.  Flexible sigmoidoscopy or colonoscopy. You may have a sigmoidoscopy every 5 years or a colonoscopy every 10 years starting at age 84.  Hepatitis C blood test.  Hepatitis B blood test.  Sexually transmitted disease (STD) testing.  Diabetes screening. This is done by checking your blood sugar (glucose) after you have not eaten for a while (fasting). You may have this done every 1-3 years.  Bone density scan. This is done to screen for osteoporosis. You may have  this done starting at age 84.  Mammogram. This may be done every 1-2 years. Talk to your health care provider about how often you should have regular mammograms. Talk with your health care provider about your test results, treatment options, and if necessary, the need for more  tests. Vaccines  Your health care provider may recommend certain vaccines, such as:  Influenza vaccine. This is recommended every year.  Tetanus, diphtheria, and acellular pertussis (Tdap, Td) vaccine. You may need a Td booster every 10 years.  Zoster vaccine. You may need this after age 84.  Pneumococcal 13-valent conjugate (PCV13) vaccine. One dose is recommended after age 44.  Pneumococcal polysaccharide (PPSV23) vaccine. One dose is recommended after age 26. Talk to your health care provider about which screenings and vaccines you need and how often you need them. This information is not intended to replace advice given to you by your health care provider. Make sure you discuss any questions you have with your health care provider. Document Released: 12/14/2015 Document Revised: 08/06/2016 Document Reviewed: 09/18/2015 Elsevier Interactive Patient Education  2017 [place] Prevention in the Home Falls can cause injuries. They can happen to people of all ages. There are many things you can do to make your home safe and to help prevent falls. What can I do on the outside of my home?  Regularly fix the edges of walkways and driveways and fix any cracks.  Remove anything that might make you trip as you walk through a door, such as a raised step or threshold.  Trim any bushes or trees on the path to your home.  Use bright outdoor lighting.  Clear any walking paths of anything that might make someone trip, such as rocks or tools.  Regularly check to see if handrails are loose or broken. Make sure that both sides of any steps have handrails.  Any raised decks and porches should have guardrails on the edges.  Have any leaves, snow, or ice cleared regularly.  Use sand or salt on walking paths during winter.  Clean up any spills in your garage right away. This includes oil or grease spills. What can I do in the bathroom?  Use night lights.  Install grab bars by the  toilet and in the tub and shower. Do not use towel bars as grab bars.  Use non-skid mats or decals in the tub or shower.  If you need to sit down in the shower, use a plastic, non-slip stool.  Keep the floor dry. Clean up any water that spills on the floor as soon as it happens.  Remove soap buildup in the tub or shower regularly.  Attach bath mats securely with double-sided non-slip rug tape.  Do not have throw rugs and other things on the floor that can make you trip. What can I do in the bedroom?  Use night lights.  Make sure that you have a light by your bed that is easy to reach.  Do not use any sheets or blankets that are too big for your bed. They should not hang down onto the floor.  Have a firm chair that has side arms. You can use this for support while you get dressed.  Do not have throw rugs and other things on the floor that can make you trip. What can I do in the kitchen?  Clean up any spills right away.  Avoid walking on wet floors.  Keep items that you use a lot in easy-to-reach  places.  If you need to reach something above you, use a strong step stool that has a grab bar.  Keep electrical cords out of the way.  Do not use floor polish or wax that makes floors slippery. If you must use wax, use non-skid floor wax.  Do not have throw rugs and other things on the floor that can make you trip. What can I do with my stairs?  Do not leave any items on the stairs.  Make sure that there are handrails on both sides of the stairs and use them. Fix handrails that are broken or loose. Make sure that handrails are as long as the stairways.  Check any carpeting to make sure that it is firmly attached to the stairs. Fix any carpet that is loose or worn.  Avoid having throw rugs at the top or bottom of the stairs. If you do have throw rugs, attach them to the floor with carpet tape.  Make sure that you have a light switch at the top of the stairs and the bottom of  the stairs. If you do not have them, ask someone to add them for you. What else can I do to help prevent falls?  Wear shoes that:  Do not have high heels.  Have rubber bottoms.  Are comfortable and fit you well.  Are closed at the toe. Do not wear sandals.  If you use a stepladder:  Make sure that it is fully opened. Do not climb a closed stepladder.  Make sure that both sides of the stepladder are locked into place.  Ask someone to hold it for you, if possible.  Clearly mark and make sure that you can see:  Any grab bars or handrails.  First and last steps.  Where the edge of each step is.  Use tools that help you move around (mobility aids) if they are needed. These include:  Canes.  Walkers.  Scooters.  Crutches.  Turn on the lights when you go into a dark area. Replace any light bulbs as soon as they burn out.  Set up your furniture so you have a clear path. Avoid moving your furniture around.  If any of your floors are uneven, fix them.  If there are any pets around you, be aware of where they are.  Review your medicines with your doctor. Some medicines can make you feel dizzy. This can increase your chance of falling. Ask your doctor what other things that you can do to help prevent falls. This information is not intended to replace advice given to you by your health care provider. Make sure you discuss any questions you have with your health care provider. Document Released: 09/13/2009 Document Revised: 04/24/2016 Document Reviewed: 12/22/2014 Elsevier Interactive Patient Education  2017 [employer].

## 2019-12-16 NOTE — Progress Notes (Signed)
This visit is being conducted via phone call due to the COVID-19 pandemic. This patient has given me verbal consent via phone to conduct this visit, patient states they are participating from their home address. Some vital signs may be absent or patient reported.   Patient identification: identified by name, DOB, and current address.  Location provider: [facility], Office Persons participating in the virtual visit: Denman George LPN, patient, and Dr. Billey Chang     Subjective:   Tammy Harding is a 86 y.o. female who presents for an Initial Medicare Annual Wellness Visit.  Review of Systems     Cardiac Risk Factors include: advanced age (>56men, >19 women);dyslipidemia;hypertension    Objective:    There were no vitals filed for this visit. There is no height or weight on file to calculate BMI.  Advanced Directives 12/16/2019  Does Patient Have a Medical Advance Directive? Yes  Type of Advance Directive Living will;Healthcare Power of Attorney  Does patient want to make changes to medical advance directive? No - Patient declined  Copy of [place] in Chart? No - copy requested    Current Medications (verified) Outpatient Encounter Medications as of 12/16/2019  Medication Sig  . albuterol (VENTOLIN HFA) 108 (90 Base) MCG/ACT inhaler Inhale 2 puffs into the lungs every 4 (four) hours as needed for wheezing or shortness of breath.  . ALPRAZolam (XANAX) 0.5 MG tablet TAKE 1/2 TABLET BY MOUTH AT BEDTIME AS NEEDED FOR ANXIETY  . amiodarone (PACERONE) 200 MG tablet Take 0.5 tablets (100 mg total) by mouth daily.  Marland Kitchen aspirin 81 MG chewable tablet Chew 162 mg by mouth daily.   . carvedilol (COREG) 12.5 MG tablet TAKE 1 TABLET BY MOUTH 2 TIMES DAILY.  . cholecalciferol (VITAMIN D3) 25 MCG (1000 UT) tablet Take 1,000 Units by mouth daily.  . cloNIDine (CATAPRES) 0.1 MG tablet TAKE 1 TABLET BY MOUTH THREE TIMES A DAY  . hydrALAZINE (APRESOLINE) 25 MG tablet Take 25 mg by  mouth 3 (three) times daily.   Marland Kitchen Hyoscyamine Sulfate SL (LEVSIN/SL) 0.125 MG SUBL Place 0.125 mg under the tongue 3 (three) times daily with meals as needed (abdominal cramping or bloating).  . Lactobacillus-Inulin ([place]) CAPS Take 1 capsule by mouth daily.  Marland Kitchen levothyroxine (SYNTHROID) 150 MCG tablet TAKE 1 TABLET BY MOUTH EVERY DAY  . pantoprazole (PROTONIX) 40 MG tablet TAKE 1 TABLET BY MOUTH EVERY DAY  . PARoxetine (PAXIL) 10 MG tablet TAKE 1 TABLET BY MOUTH EVERY DAY  . busPIRone (BUSPAR) 7.5 MG tablet TAKE 1 TABLET BY MOUTH 3 TIMES DAILY AS NEEDED ANXIETY. (Patient not taking: Reported on 12/16/2019)  . ipratropium (ATROVENT HFA) 17 MCG/ACT inhaler Inhale 2 puffs into the lungs 2 (two) times daily. (Patient not taking: Reported on 12/16/2019)   No facility-administered encounter medications on file as of 12/16/2019.    Allergies (verified) Penicillins, Amlodipine, Bee venom, Hctz [hydrochlorothiazide], Horse epithelium, Hydralazine, Lisinopril, and Losartan   History: Past Medical History:  Diagnosis Date  . Allergy   . Anxiety   . Arthritis   . Chronic anxiety 08/19/2018  . Chronic atrial fibrillation ([place]) 08/19/2018  . Depression   . Essential hypertension 08/19/2018  . Thyroid cancer [facility])    Past Surgical History:  Procedure Laterality Date  . APPENDECTOMY    . THYROIDECTOMY     1979  . TONSILLECTOMY AND ADENOIDECTOMY     Family History  Problem Relation Age of Onset  . Healthy Son   .  Arthritis Mother   . Hypertension Mother   . Kidney disease Mother   . Arthritis Father   . Heart attack Father   . Hypertension Sister   . Depression Sister   . Hypertension Sister    Social History   Socioeconomic History  . Marital status: Widowed    Spouse name: Not on file  . Number of children: 1  . Years of education: Not on file  . Highest education level: Not on file  Occupational History  . Not on file  Tobacco Use  . Smoking status: Never  Smoker  . Smokeless tobacco: Never Used  Substance and Sexual Activity  . Alcohol use: Never  . Drug use: Never  . Sexual activity: Not Currently  Other Topics Concern  . Not on file  Social History Narrative   Assisted as needed by son Montique Scarborough    Social Determinants of Health   Financial Resource Strain:   . Difficulty of Paying Living Expenses: Not on file  Food Insecurity:   . Worried About Charity fundraiser in the Last Year: Not on file  . Ran Out of Food in the Last Year: Not on file  Transportation Needs:   . Lack of Transportation (Medical): Not on file  . Lack of Transportation (Non-Medical): Not on file  Physical Activity:   . Days of Exercise per Week: Not on file  . Minutes of Exercise per Session: Not on file  Stress:   . Feeling of Stress : Not on file  Social Connections:   . Frequency of Communication with Friends and Family: Not on file  . Frequency of Social Gatherings with Friends and Family: Not on file  . Attends Religious Services: Not on file  . Active Member of Clubs or Organizations: Not on file  . Attends Archivist Meetings: Not on file  . Marital Status: Not on file    Tobacco Counseling Counseling given: Not Answered   Clinical Intake:  Pre-visit preparation completed: Yes  Pain : No/denies pain  Diabetes: No  How often do you need to have someone help you when you read instructions, pamphlets, or other written materials from your doctor or pharmacy?: 1 - Never  Interpreter Needed?: No  Information entered by :: Denman George LPN   Activities of Daily Living In your present state of health, do you have any difficulty performing the following activities: 12/16/2019 06/30/2019  Hearing? N N  Vision? N Y  Difficulty concentrating or making decisions? N N  Walking or climbing stairs? N N  Dressing or bathing? N N  Doing errands, shopping? N N  Preparing Food and eating ? N -  Using the Toilet? N -  In the past six  months, have you accidently leaked urine? N -  Do you have problems with loss of bowel control? N -  Managing your Medications? N -  Managing your Finances? N -  Housekeeping or managing your Housekeeping? N -  Some recent data might be hidden     Immunizations and Health Maintenance Immunization History  Administered Date(s) Administered  . Influenza Whole 11/09/2008, 09/13/2009, 10/15/2010  . Influenza, High Dose Seasonal PF 09/16/2011, 09/30/2012, 10/31/2013, 10/31/2014, 10/17/2015, 09/01/2016, 10/12/2018  . Influenza,inj,Quad PF,6+ Mos 12/03/2017, 09/22/2019  . Influenza-Unspecified 10/11/1999, 12/09/2000, 09/23/2002, 09/12/2003, 10/18/2004, 11/07/2005, 10/15/2006, 11/18/2007  . Pneumococcal-Unspecified 10/10/2003, 09/24/2009   There are no preventive care reminders to display for this patient.  Patient Care Team: Leamon Arnt, MD as PCP -  General (Family Medicine) Lorretta Harp, MD as Consulting Physician (Cardiology)  Indicate any recent Medical Services you may have received from other than [facility] providers in the past year (date may be approximate).     Assessment:   This is a routine wellness examination for [place].  Hearing/Vision screen No exam data present  Dietary issues and exercise activities discussed: Current Exercise Habits: The patient does not participate in regular exercise at present  Goals   None    Depression Screen PHQ 2/9 Scores 12/16/2019 06/30/2019 12/30/2018 08/19/2018  PHQ - 2 Score 0 0 0 0  PHQ- 9 Score - - 0 -    Fall Risk Fall Risk  12/16/2019 06/30/2019 12/30/2018 08/19/2018  Falls in the past year? 0 0 0 No  Number falls in past yr: 0 0 - -  Injury with Fall? 0 0 - -  Follow up Falls evaluation completed;Education provided;Falls prevention discussed Falls evaluation completed Falls evaluation completed;Education provided;Falls prevention discussed -    Is the patient's home free of loose throw rugs in walkways, pet beds, electrical  cords, etc?   yes      Grab bars in the bathroom? yes      Handrails on the stairs?   yes      Adequate lighting?   yes   Cognitive Function: no cognitive concerns at this time      6CIT Screen 12/16/2019  What Year? 0 points  What month? 0 points  What time? 0 points  Count back from 20 0 points  Months in reverse 0 points  Repeat phrase 2 points  Total Score 2    Screening Tests Health Maintenance  Topic Date Due  . DEXA SCAN  06/29/2020 (Originally 12/01/2014)  . INFLUENZA VACCINE  Completed  . TETANUS/TDAP  Discontinued  . PNA vac Low Risk Adult  Discontinued    Qualifies for Shingles Vaccine? Discussed and patient will check with pharmacy for coverage.  Patient education handout provided   Cancer Screenings: Lung: Low Dose CT Chest recommended if Age 4-80 years, 30 pack-year currently smoking OR have quit w/in 15years. Patient does not qualify. Breast: Up to date on Mammogram? Yes   Up to date of Bone Density/Dexa? No; deferred at this time due to Covid pandemic  Colorectal: No longer indicated    Plan:  I have personally reviewed and addressed the Medicare Annual Wellness questionnaire and have noted the following in the patient's chart:  A. Medical and social history B. Use of alcohol, tobacco or illicit drugs  C. Current medications and supplements D. Functional ability and status E.  Nutritional status F.  Physical activity G. Advance directives H. List of other physicians I.  Hospitalizations, surgeries, and ER visits in previous 12 months J.  [place] such as hearing and vision if needed, cognitive and depression L. Referrals, records requested, and appointments- none   In addition, I have reviewed and discussed with patient certain preventive protocols, quality metrics, and best practice recommendations. A written personalized care plan for preventive services as well as general preventive health recommendations were provided to  patient.   Signed,  Denman George, LPN  Nurse Health Advisor   Nurse Notes: no additional

## 2020-01-10 ENCOUNTER — Other Ambulatory Visit: Payer: Self-pay | Admitting: Family Medicine

## 2020-01-11 ENCOUNTER — Other Ambulatory Visit: Payer: Self-pay | Admitting: Family Medicine

## 2020-01-11 DIAGNOSIS — F419 Anxiety disorder, unspecified: Secondary | ICD-10-CM

## 2020-02-01 NOTE — Progress Notes (Signed)
Virtual Visit via Telephone Note   This visit type was conducted due to national recommendations for restrictions regarding the COVID-19 Pandemic (e.g. social distancing) in an effort to limit this patient's exposure and mitigate transmission in our community.  Due to her co-morbid illnesses, this patient is at least at moderate risk for complications without adequate follow up.  This format is felt to be most appropriate for this patient at this time.  The patient did not have access to video technology/had technical difficulties with video requiring transitioning to audio format only (telephone).  All issues noted in this document were discussed and addressed.  No physical exam could be performed with this format.  Please refer to the patient's chart for her  consent to telehealth for [facility]. Virtual platform was offered given ongoing worsening Covid-19 pandemic.  Date:  02/02/2020   ID:  Tammy Harding, DOB 07/03/1932, MRN [id number]  Patient Location: Home Provider Location: [facility]  PCP:  Leamon Arnt, MD  Cardiologist:  Quay Burow, MD  Electrophysiologist:  None   Evaluation Performed:  Follow-Up Visit  Chief Complaint:  follow up of atrial fibrillation  History of Present Illness:    Tammy Harding is a 86 y.o. female with a history of paroxysmal atrial fibrillation not on anticoagulation (only Aspirin), hypertension, thyroid cancer in 1979, hypothyroidism, arthritis, anxiety, and depression who is followed by Dr. Gwenlyn Found and presents today for follow-up.   Patient was initially seen by Dr. Gwenlyn Found in 08/2018 after moving to [facility] from [place], [place]. She has a remote history of paroxysmal atrial fibrillation. She has been maintained on Amiodarone but is not on any oral anticoagulation. She takes Aspirin 162mg  daily. She was doing well from a cardiac standpoint at that time. No medication changes were made at that time. She was advised to follow-up in 1  year.   Patient presents today for follow-up via phone visit.  Patient reports worsening dyspnea on exertion that she describes as feeling "smothered" when she walks, especially when she is wearing a face mask. She had a hard time elaborating on this feel but states she gets very hot and starts sweating when she walks with a mask on. However, she also states that she has shortness of breath when she ambulates without a mask. She states this has been going on for a long time (years) but has gotten worse over the last 6-12 months. No orthopnea, PND, or edema. No chest pain, palpitations, lightheadedness, or dizziness. No abnormal bleeding. She occasional checks her BP at home and states it is normal in the 130's/70's.    Past Medical History:  Diagnosis Date  . Allergy   . Anxiety   . Arthritis   . Chronic anxiety 08/19/2018  . Chronic atrial fibrillation ([place]) 08/19/2018  . Depression   . Essential hypertension 08/19/2018  . Thyroid cancer [facility])    Past Surgical History:  Procedure Laterality Date  . APPENDECTOMY    . THYROIDECTOMY     1979  . TONSILLECTOMY AND ADENOIDECTOMY       Current Meds  Medication Sig  . albuterol (VENTOLIN HFA) 108 (90 Base) MCG/ACT inhaler Inhale 2 puffs into the lungs every 4 (four) hours as needed for wheezing or shortness of breath.  . ALPRAZolam (XANAX) 0.5 MG tablet TAKE 1/2 TABLET BY MOUTH AT BEDTIME AS NEEDED FOR ANXIETY  . aspirin 81 MG chewable tablet Chew 81 mg by mouth daily.  . carvedilol (COREG) 12.5 MG tablet TAKE 1 TABLET  BY MOUTH 2 TIMES DAILY.  . cholecalciferol (VITAMIN D3) 25 MCG (1000 UT) tablet Take 1,000 Units by mouth daily.  . cloNIDine (CATAPRES) 0.1 MG tablet TAKE 1 TABLET BY MOUTH THREE TIMES A DAY  . hydrALAZINE (APRESOLINE) 25 MG tablet Take 25 mg by mouth 3 (three) times daily.   Marland Kitchen Hyoscyamine Sulfate SL (LEVSIN/SL) 0.125 MG SUBL Place 0.125 mg under the tongue 3 (three) times daily with meals as needed (abdominal cramping or  bloating).  . Lactobacillus-Inulin ([place]) CAPS Take 1 capsule by mouth daily.  Marland Kitchen levothyroxine (SYNTHROID) 150 MCG tablet TAKE 1 TABLET BY MOUTH EVERY DAY  . PARoxetine (PAXIL) 10 MG tablet TAKE 1 TABLET BY MOUTH EVERY DAY  . [DISCONTINUED] amiodarone (PACERONE) 200 MG tablet TAKE 1/2 TABLET BY MOUTH DAILY     Allergies:   Penicillins, Amlodipine, Bee venom, Hctz [hydrochlorothiazide], Horse epithelium, Hydralazine, Lisinopril, and Losartan   Social History   Tobacco Use  . Smoking status: Never Smoker  . Smokeless tobacco: Never Used  Substance Use Topics  . Alcohol use: Never  . Drug use: Never     Family Hx: The patient's family history includes Arthritis in her father and mother; Depression in her sister; Healthy in her son; Heart attack in her father; Hypertension in her mother, sister, and sister; Kidney disease in her mother.  ROS:   Please see the history of present illness.    All other systems reviewed and are negative.   Prior CV studies:    The following studies were reviewed today:  None  Labs/Other Tests and Data Reviewed:    EKG: Last EKG from 09/21/2018 personally reviewed and showed normal sinus rhythm, rate 71 bpm, with 1st degree AV block and isolated T wave inversions in lead aVL  Recent Labs: 10/18/2019: ALT 9; BUN 18; Creatinine, Ser 1.22; Hemoglobin 12.7; Platelets 201.0; Potassium 4.5; Sodium 137; TSH 0.28   Recent Lipid Panel Lab Results  Component Value Date/Time   CHOL 204 (H) 12/30/2018 10:12 AM   TRIG 77.0 12/30/2018 10:12 AM   HDL 61.00 12/30/2018 10:12 AM   CHOLHDL 3 12/30/2018 10:12 AM   LDLCALC 127 (H) 12/30/2018 10:12 AM    Wt Readings from Last 3 Encounters:  10/18/19 153 lb (69.4 kg)  08/02/19 156 lb 9.6 oz (71 kg)  07/19/19 157 lb 9.6 oz (71.5 kg)     Objective:    Vital Signs:  BP (!) 145/80   Pulse 70    Vital Signs Reviewed.  General: No acute distress. Pulm: No labored breathing. No coughing  during visit. No audible wheezing. Speaking in full sentences. Neuro: Alert and oriented. No slurred speech. Answers questions appropriately. Psych: Pleasant affect.  ASSESSMENT & PLAN:    Dyspnea on Exertion - Patient reports she feels like she is smothering when she walks. She states this has been going on for several years but has gotten worse the last 6-12 months. No other CHF symptoms. No chest pain. - Wonder if she could have some pulmonary toxicity given she has been on Amiodarone for many years. Discussed with Dr. Gwenlyn Found. Will stop Amiodarone and order PFTs. If PFTs are normal, will then check Echo. - Some of her dyspnea may be due to deconditioning as she is not very active. Recommended increasing activity.   Paroxysmal Atrial Fibrillation - Remote history of atrial fibrillation. Unable to assess rhythm given virtual visit. It does not sound like she has had an recurrence of her atrial fibrillation in a  very long time. Last EKG in 08/2018 showed normal sinus rhythm. - Will stop Amiodarone 100mg  daily due to concern for pulmonary toxicity. Chest x-ray in 01/2019 showed no acute findings. Will check PFTs.  - Continue Coreg 12.5mg  twice daily.  - Will order 2 week Zio monitor to see if she has any recurrent atrial fibrillation. - CHA2DS2-VASc = 4 (HTN, age x2, female). Sounds like she was previously on Coumadin but they had a hard time keeping INR therapeutic. Now on Aspirin 162 daily. Can reduce this to 81mg  daily. However,she has not been on anticoagulation and has been maintained on Aspirin 162mg  daily.  - PCP checked CMET and TSH in 10/2019. LFTs normal. TSH slightly low at 0.28. Patient was instructed to follow-up but it does not look like this happened. Will recheck CMET and TSH.  Hypertension - BP mildly elevated at 145/80 this morning but patient states she thinks she is just excited. Normally in the 130's/70. - Continue current medications: Coreg 12.5mg  twice daily, Clonidine 0.1mg   three times daily, and Hydralazine 25mg  three times daily. - Advised patient to continue to monitor BP at home.   Hyperlipidemia - Lipid panel from 12/2018: Total Cholesterol 204, Triglycerides 77, HDL 61, LDL 127.  - She has declined statin therapy in the past. She states she does not want to start a statin unless it it absolutely needed.  - Will check repeat lipid panel and CMET.  COVID-19 Education: The signs and symptoms of COVID-19 were discussed with the patient and how to seek care for testing (follow up with PCP or arrange E-visit).  The importance of social distancing was discussed today.  Time:   Today, I have spent 21 minutes with the patient with telehealth technology discussing the above problems.     Medication Adjustments/Labs and Tests Ordered: Current medicines are reviewed at length with the patient today.  Concerns regarding medicines are outlined above.   Follow Up:  In Person in 6 month(s)  Signed, Eppie Gibson  02/02/2020 1:30 PM    [place]

## 2020-02-02 ENCOUNTER — Telehealth (INDEPENDENT_AMBULATORY_CARE_PROVIDER_SITE_OTHER): Payer: Medicare Other | Admitting: Student

## 2020-02-02 ENCOUNTER — Telehealth: Payer: Self-pay | Admitting: *Deleted

## 2020-02-02 VITALS — BP 145/80 | HR 70

## 2020-02-02 DIAGNOSIS — R0600 Dyspnea, unspecified: Secondary | ICD-10-CM

## 2020-02-02 DIAGNOSIS — R0609 Other forms of dyspnea: Secondary | ICD-10-CM

## 2020-02-02 DIAGNOSIS — Z79899 Other long term (current) drug therapy: Secondary | ICD-10-CM

## 2020-02-02 DIAGNOSIS — I10 Essential (primary) hypertension: Secondary | ICD-10-CM

## 2020-02-02 DIAGNOSIS — E785 Hyperlipidemia, unspecified: Secondary | ICD-10-CM

## 2020-02-02 DIAGNOSIS — I480 Paroxysmal atrial fibrillation: Secondary | ICD-10-CM

## 2020-02-02 NOTE — Telephone Encounter (Signed)
Patient enrolled for [employer] to mail a 14 day ZIO XT long term holter monitor to her home.  Instructions reviewed briefly as they are included in the monitor kit. 

## 2020-02-02 NOTE — Patient Instructions (Signed)
Medication Instructions:  Sande Rives, PA has recommended making the following medication changes: 1. STOP Amiodarone 2. DECREASE Aspirin to 81 mg daily  *If you need a refill on your cardiac medications before your next appointment, please call your pharmacy*   Lab Work: Your physician recommends that you return for lab work at your earliest [place].  If you have labs (blood work) drawn today and your tests are completely normal, you will receive your results only by: Marland Kitchen MyChart Message (if you have MyChart) OR . A paper copy in the mail If you have any lab test that is abnormal or we need to change your treatment, we will call you to review the results.   Testing/Procedures: Our physician has recommended that you wear an 14 DAY ZIO-PATCH monitor. The Zio patch cardiac monitor continuously records heart rhythm data for up to 14 days, this is for patients being evaluated for multiple types heart rhythms. For the first 24 hours post application, please avoid getting the Zio monitor wet in the shower or by excessive sweating during exercise. After that, feel free to carry on with regular activities. Keep soaps and lotions away from the ZIO XT Patch.   This will be placed at our [facility] location - [address], [address].     2. Pulmonary Function Test - Your physician has recommended that you have a pulmonary function test. Pulmonary Function Tests are a group of tests that measure how well air moves in and out of your lungs.   Follow-Up: At [facility], you and your health needs are our priority.  As part of our continuing mission to provide you with exceptional heart care, we have created designated Provider Care Teams.  These Care Teams include your primary Cardiologist (physician) and Advanced Practice Providers (APPs -  Physician Assistants and Nurse Practitioners) who all work together to provide you with the care you need, when you need it.  We recommend  signing up for the patient portal called "MyChart".  Sign up information is provided on this After Visit Summary.  MyChart is used to connect with patients for Virtual Visits (Telemedicine).  Patients are able to view lab/test results, encounter notes, upcoming appointments, etc.  Non-urgent messages can be sent to your provider as well.   To learn more about what you can do with MyChart, go to [email or web address].    Your next appointment:   6 month(s)  The format for your next appointment:   Either In Person or Virtual  Provider:   You may see Quay Burow, MD or one of the following Advanced Practice Providers on your designated Care Team:    Kerin Ransom, PA-C  [place], [place]  Coletta Memos, [place]    Other Instructions [place] Monitor Instructions   Your physician has requested you wear your ZIO patch monitor 14 days.   This is a single patch monitor.  [employer] supplies one patch monitor per enrollment.  Additional stickers are not available.   Please do not apply patch if you will be having a Nuclear Stress Test, Echocardiogram, Cardiac CT, MRI, or Chest Xray during the time frame you would be wearing the monitor. The patch cannot be worn during these tests.  You cannot remove and re-apply the ZIO XT patch monitor.   Your ZIO patch monitor will be sent USPS Priority mail from [facility] directly to your home address. The monitor may also be mailed to a PO BOX if  home delivery is not available.   It may take 3-5 days to receive your monitor after you have been enrolled.   Once you have received you monitor, please review enclosed instructions.  Your monitor has already been registered assigning a specific monitor serial # to you.   Applying the monitor   Shave hair from upper left chest.   Hold abrader disc by orange tab.  Rub abrader in 40 strokes over left upper chest as indicated in your monitor instructions.   Clean area with 4 enclosed  alcohol pads .  Use all pads to assure are is cleaned thoroughly.  Let dry.   Apply patch as indicated in monitor instructions.  Patch will be place under collarbone on left side of chest with arrow pointing upward.   Rub patch adhesive wings for 2 minutes.Remove white label marked "1".  Remove white label marked "2".  Rub patch adhesive wings for 2 additional minutes.   While looking in a mirror, press and release button in center of patch.  A small green light will flash 3-4 times .  This will be your only indicator the monitor has been turned on.     Do not shower for the first 24 hours.  You may shower after the first 24 hours.   Press button if you feel a symptom. You will hear a small click.  Record Date, Time and Symptom in the Patient Log Book.   When you are ready to remove patch, follow instructions on last 2 pages of Patient Log Book.  Stick patch monitor onto last page of Patient Log Book.   Place Patient Log Book in [place] box.  Use locking tab on box and tape box closed securely.  The [employer] and [employer] has [employer] on it.  Please place in mailbox as soon as possible.  Your physician should have your test results approximately 7 days after the monitor has been mailed back to [facility].   Call [place] at [phone] if you have questions regarding your ZIO XT patch monitor.  Call them immediately if you see an orange light blinking on your monitor.   If your monitor falls off in less than 4 days contact our Monitor department at [phone].  If your monitor becomes loose or falls off after 4 days call [employer] at [phone] for suggestions on securing your monitor.

## 2020-02-03 NOTE — Progress Notes (Signed)
Please call patient. She is overdue for her annual cpe visit with me (due back in January). Please call and schedule. Thanks.

## 2020-02-03 NOTE — Progress Notes (Signed)
Pt states she does not want to schedule a CPE right now. Pt wants to wait to see cardiologist first. Asked pt if she wanted to make appt for a few weeks out and pt refused. Pt states she will call back in a few weeks to schedule.

## 2020-02-07 ENCOUNTER — Other Ambulatory Visit (INDEPENDENT_AMBULATORY_CARE_PROVIDER_SITE_OTHER): Payer: Medicare Other

## 2020-02-07 DIAGNOSIS — I480 Paroxysmal atrial fibrillation: Secondary | ICD-10-CM | POA: Diagnosis not present

## 2020-02-13 ENCOUNTER — Telehealth: Payer: Self-pay | Admitting: Cardiovascular Disease

## 2020-02-13 NOTE — Telephone Encounter (Signed)
The patient has been made aware that it is fine to take her thyroid medication before her lab test.

## 2020-02-13 NOTE — Telephone Encounter (Signed)
Patient calling in to see if it is okay for her to take her thyroid medication before her appt on Friday 02/17/20.

## 2020-02-15 ENCOUNTER — Other Ambulatory Visit: Payer: Self-pay | Admitting: Family Medicine

## 2020-02-15 DIAGNOSIS — F419 Anxiety disorder, unspecified: Secondary | ICD-10-CM

## 2020-02-17 ENCOUNTER — Other Ambulatory Visit: Payer: Self-pay

## 2020-02-17 ENCOUNTER — Other Ambulatory Visit: Payer: Medicare Other | Admitting: *Deleted

## 2020-02-17 DIAGNOSIS — R0600 Dyspnea, unspecified: Secondary | ICD-10-CM | POA: Diagnosis not present

## 2020-02-17 DIAGNOSIS — Z79899 Other long term (current) drug therapy: Secondary | ICD-10-CM | POA: Diagnosis not present

## 2020-02-17 DIAGNOSIS — I10 Essential (primary) hypertension: Secondary | ICD-10-CM | POA: Diagnosis not present

## 2020-02-17 DIAGNOSIS — E785 Hyperlipidemia, unspecified: Secondary | ICD-10-CM | POA: Diagnosis not present

## 2020-02-17 DIAGNOSIS — I480 Paroxysmal atrial fibrillation: Secondary | ICD-10-CM | POA: Diagnosis not present

## 2020-02-17 LAB — LIPID PANEL
Chol/HDL Ratio: 2.7 ratio (ref 0.0–4.4)
Cholesterol, Total: 180 mg/dL (ref 100–199)
HDL: 66 mg/dL (ref >39)
LDL Chol Calc (NIH): 100 mg/dL — ABNORMAL HIGH (ref 0–99)
Triglycerides: 76 mg/dL (ref 0–149)
VLDL Cholesterol Cal: 14 mg/dL (ref 5–40)

## 2020-02-17 LAB — COMPREHENSIVE METABOLIC PANEL
ALT: 8 IU/L (ref 0–32)
AST: 15 IU/L (ref 0–40)
Albumin/Globulin Ratio: 1.8 (ref 1.2–2.2)
Albumin: 4.3 g/dL (ref 3.6–4.6)
Alkaline Phosphatase: 75 IU/L (ref 39–117)
BUN/Creatinine Ratio: 12 (ref 12–28)
BUN: 16 mg/dL (ref 8–27)
Bilirubin Total: 0.6 mg/dL (ref 0.0–1.2)
CO2: 27 mmol/L (ref 20–29)
Calcium: 9.4 mg/dL (ref 8.7–10.3)
Chloride: 94 mmol/L — ABNORMAL LOW (ref 96–106)
Creatinine, Ser: 1.29 mg/dL — ABNORMAL HIGH (ref 0.57–1.00)
GFR calc Af Amer: 44 mL/min/{1.73_m2} — ABNORMAL LOW (ref >59)
GFR calc non Af Amer: 38 mL/min/{1.73_m2} — ABNORMAL LOW (ref >59)
Globulin, Total: 2.4 g/dL (ref 1.5–4.5)
Glucose: 97 mg/dL (ref 65–99)
Potassium: 4.2 mmol/L (ref 3.5–5.2)
Sodium: 134 mmol/L (ref 134–144)
Total Protein: 6.7 g/dL (ref 6.0–8.5)

## 2020-02-17 LAB — TSH: TSH: 0.185 u[IU]/mL — ABNORMAL LOW (ref 0.450–4.500)

## 2020-02-20 ENCOUNTER — Telehealth: Payer: Self-pay | Admitting: Family Medicine

## 2020-02-20 NOTE — Telephone Encounter (Signed)
Called patient to get scheduled, but states that she would prefer to come this week and then said she was going to call her cardiologist to ask for their advice.

## 2020-02-20 NOTE — Telephone Encounter (Signed)
Also asked if she would need to fast for the appointment.

## 2020-02-20 NOTE — Telephone Encounter (Signed)
Patient called in saying she spoke with her cardiologist and was told her thyroid levels were super low and told the patient to call to have labs rechecked or schedule an appointment, appointment wise the next available is not until April, and patient asked if she can just come in for a lab appointment.

## 2020-02-20 NOTE — Telephone Encounter (Signed)
Ok to schedule lab visit. 

## 2020-02-20 NOTE — Telephone Encounter (Signed)
Schedule OV with me for April.

## 2020-02-20 NOTE — Telephone Encounter (Signed)
See below

## 2020-02-21 ENCOUNTER — Telehealth: Payer: Self-pay

## 2020-02-21 NOTE — Telephone Encounter (Signed)
Granddaughter notified via voicemail.

## 2020-02-21 NOTE — Telephone Encounter (Signed)
Patient granddaughter schedule appt 03/12/20 at 9:00am regarding thyroid level. Granddaughter would like to know will any blood work be completed during this appt and if so would she need to fast.

## 2020-02-21 NOTE — Telephone Encounter (Signed)
Error

## 2020-02-21 NOTE — Telephone Encounter (Signed)
Please advise 

## 2020-02-21 NOTE — Telephone Encounter (Signed)
Yes, I will check her blood work at that visit and no need to be fasting.

## 2020-03-02 DIAGNOSIS — I480 Paroxysmal atrial fibrillation: Secondary | ICD-10-CM | POA: Diagnosis not present

## 2020-03-12 ENCOUNTER — Encounter: Payer: Self-pay | Admitting: Family Medicine

## 2020-03-12 ENCOUNTER — Ambulatory Visit (INDEPENDENT_AMBULATORY_CARE_PROVIDER_SITE_OTHER): Payer: Medicare Other | Admitting: Family Medicine

## 2020-03-12 ENCOUNTER — Telehealth: Payer: Self-pay

## 2020-03-12 ENCOUNTER — Other Ambulatory Visit: Payer: Self-pay

## 2020-03-12 VITALS — BP 146/82 | HR 68 | Temp 96.6°F | Resp 15 | Ht 69.0 in | Wt 154.6 lb

## 2020-03-12 DIAGNOSIS — E782 Mixed hyperlipidemia: Secondary | ICD-10-CM

## 2020-03-12 DIAGNOSIS — N1831 Chronic kidney disease, stage 3a: Secondary | ICD-10-CM | POA: Diagnosis not present

## 2020-03-12 DIAGNOSIS — I10 Essential (primary) hypertension: Secondary | ICD-10-CM | POA: Diagnosis not present

## 2020-03-12 DIAGNOSIS — E890 Postprocedural hypothyroidism: Secondary | ICD-10-CM

## 2020-03-12 DIAGNOSIS — Z85850 Personal history of malignant neoplasm of thyroid: Secondary | ICD-10-CM

## 2020-03-12 DIAGNOSIS — I480 Paroxysmal atrial fibrillation: Secondary | ICD-10-CM | POA: Diagnosis not present

## 2020-03-12 DIAGNOSIS — J449 Chronic obstructive pulmonary disease, unspecified: Secondary | ICD-10-CM | POA: Diagnosis not present

## 2020-03-12 LAB — POCT URINALYSIS DIPSTICK
Bilirubin, UA: NEGATIVE
Blood, UA: NEGATIVE
Glucose, UA: NEGATIVE
Ketones, UA: NEGATIVE
Leukocytes, UA: NEGATIVE
Nitrite, UA: NEGATIVE
Protein, UA: NEGATIVE
Spec Grav, UA: 1.01 (ref 1.010–1.025)
Urobilinogen, UA: 0.2 E.U./dL
pH, UA: 6 (ref 5.0–8.0)

## 2020-03-12 LAB — RENAL FUNCTION PANEL
Albumin: 4 g/dL (ref 3.5–5.2)
BUN: 20 mg/dL (ref 6–23)
CO2: 31 mEq/L (ref 19–32)
Calcium: 9 mg/dL (ref 8.4–10.5)
Chloride: 96 mEq/L (ref 96–112)
Creatinine, Ser: 1.17 mg/dL (ref 0.40–1.20)
GFR: 43.85 mL/min — ABNORMAL LOW (ref >60.00)
Glucose, Bld: 92 mg/dL (ref 70–99)
Phosphorus: 4 mg/dL (ref 2.3–4.6)
Potassium: 4.8 mEq/L (ref 3.5–5.1)
Sodium: 134 mEq/L — ABNORMAL LOW (ref 135–145)

## 2020-03-12 LAB — TSH: TSH: 0.09 u[IU]/mL — ABNORMAL LOW (ref 0.35–4.50)

## 2020-03-12 MED ORDER — HYDRALAZINE HCL 25 MG PO TABS: 25.0000 mg | ORAL_TABLET | Freq: Three times a day (TID) | ORAL | 3 refills | Status: AC

## 2020-03-12 MED ORDER — LEVOTHYROXINE SODIUM 137 MCG PO TABS: 150.0000 ug | ORAL_TABLET | Freq: Every day | ORAL | 0 refills | Status: DC

## 2020-03-12 NOTE — Progress Notes (Signed)
Subjective  CC:  Chief Complaint  Patient presents with  . Thyroid Problem    requesting refill hydralazine    HPI: Tammy Harding is a 86 y.o. female who presents to the office today to address the problems listed above in the chief complaint.   Hypothyroidism f/u: Tammy Harding is a 86 y.o. female who presents for follow up of hypothyroidism. Last TSH showed control was overtreated: first noted back in November with recommended recheck that pt didn't get scheduled, then last month checked by cardiology and TSH is low on synthroid 116mcg daily. Current symptoms: none . Patient denies change in energy level, diarrhea, heat / cold intolerance, nervousness, palpitations and weight changes. She does c/o worsening shortness of breath  COPD/asthma overlap: only using albuterol prn. Says couldn't afford anoro or atrovent. Sob limits walking. No sob at rest. No cough or sputum production. No recent exacerbations. Uses albuterol which helps a little.  Reviewed last pft from 2010; was supposed to get another last year but never did. Reviewed prior pulm notes. Has been prescribed multiple inhalers over the years with only intermittent use or problems with cost.   HTN: mildly elevated today; has been out of hydralazine. No cp. Seeing cards. PAF not on anticoagulation.   New decrease is GFR/ creatinine elevation: likely due to nephrosclerosis. Will monitor. ultarsound of renals if needed. No sxs of volume overload.   Assessment  1. Postoperative hypothyroidism   2. History of thyroid cancer   3. Paroxysmal atrial fibrillation (HCC)   4. Essential hypertension   5. Mixed hyperlipidemia   6. Stage 3a chronic kidney disease   7. Asthma-chronic obstructive pulmonary disease overlap syndrome ([place])      Plan   Hypothyroidism:  recheck labs today and adjust meds accordingly. Decrease doe sto 151mcg down from 135mcg daily and recheck in 6 weeks.  HTN: restart hydralazine and recheck in 3 months  CKD:  ;check renal panel and urinalysis  Asthma/copd: try to get Breztri samples for patient to use.continue prn albuterol  paf per cards   Follow up: Return in about 3 months (around 06/11/2020) for recheck COPD and thyroid recheck. also lab visit in 6 weeks for tsh.  Visit date not found  Orders Placed This Encounter  Procedures  . TSH  . Renal function panel  . POCT urinalysis dipstick   Meds ordered this encounter  Medications  . hydrALAZINE (APRESOLINE) 25 MG tablet    Sig: Take 1 tablet (25 mg total) by mouth 3 (three) times daily.    Dispense:  270 tablet    Refill:  3  . levothyroxine (SYNTHROID) 137 MCG tablet    Sig: Take 1 tablet (137 mcg total) by mouth daily.    Dispense:  90 tablet    Refill:  0      I reviewed the patients updated PMH, FH, and SocHx.    Patient Active Problem List   Diagnosis Date Noted  . Long term prescription benzodiazepine use 10/12/2018    Priority: High  . Postoperative hypothyroidism 08/19/2018    Priority: High  . History of thyroid cancer 08/19/2018    Priority: High  . Paroxysmal atrial fibrillation ([place]) 08/30/2015    Priority: High  . Mixed hyperlipidemia 02/27/2015    Priority: High  . Essential hypertension 08/29/2014    Priority: High  . Osteoporosis 04/03/2014    Priority: High  . Insomnia 08/13/2010    Priority: High  . Asthma-chronic obstructive pulmonary disease overlap syndrome ([place]) 05/22/2009  Priority: High  . Generalized anxiety disorder 04/23/1999    Priority: High  . IBS (irritable bowel syndrome) 08/07/2009    Priority: Medium  . Gout 12/21/2008    Priority: Medium  . Esophageal reflux 05/03/2004    Priority: Medium  . Vitamin D deficiency 06/24/2012    Priority: Low  . Allergic rhinitis due to pollen 03/27/2005    Priority: Low  . Lung nodule 09/02/2016   Current Meds  Medication Sig  . albuterol (VENTOLIN HFA) 108 (90 Base) MCG/ACT inhaler Inhale 2 puffs into the lungs every 4 (four) hours as  needed for wheezing or shortness of breath.  . ALPRAZolam (XANAX) 0.5 MG tablet TAKE 1/2 TABLET BY MOUTH AT BEDTIME AS NEEDED FOR ANXIETY  . aspirin 81 MG chewable tablet Chew 81 mg by mouth daily.  . carvedilol (COREG) 12.5 MG tablet TAKE 1 TABLET BY MOUTH 2 TIMES DAILY.  . cholecalciferol (VITAMIN D3) 25 MCG (1000 UT) tablet Take 1,000 Units by mouth daily.  . cloNIDine (CATAPRES) 0.1 MG tablet TAKE 1 TABLET BY MOUTH THREE TIMES A DAY  . hydrALAZINE (APRESOLINE) 25 MG tablet Take 1 tablet (25 mg total) by mouth 3 (three) times daily.  . Lactobacillus-Inulin ([place]) CAPS Take 1 capsule by mouth daily.  Marland Kitchen levothyroxine (SYNTHROID) 137 MCG tablet Take 1 tablet (137 mcg total) by mouth daily.  Marland Kitchen PARoxetine (PAXIL) 10 MG tablet TAKE 1 TABLET BY MOUTH EVERY DAY  . [DISCONTINUED] hydrALAZINE (APRESOLINE) 25 MG tablet Take 25 mg by mouth 3 (three) times daily.   . [DISCONTINUED] levothyroxine (SYNTHROID) 150 MCG tablet TAKE 1 TABLET BY MOUTH EVERY DAY    Allergies: Patient is allergic to penicillins; amlodipine; bee venom; hctz [hydrochlorothiazide]; horse epithelium; lisinopril; and losartan. Family History: Patient family history includes Arthritis in her father and mother; Depression in her sister; Healthy in her son; Heart attack in her father; Hypertension in her mother, sister, and sister; Kidney disease in her mother. Social History:  Patient  reports that she has never smoked. She has never used smokeless tobacco. She reports that she does not drink alcohol or use drugs.  Review of Systems: Constitutional: Negative for fever malaise or anorexia Cardiovascular: negative for chest pain Respiratory: negative for SOB or persistent cough Gastrointestinal: negative for abdominal pain  Objective  Vitals: BP (!) 146/82   Pulse 68   Temp (!) 96.6 F (35.9 C) (Temporal)   Resp 15   Ht 5\' 9"  (1.753 m)   Wt 154 lb 9.6 oz (70.1 kg)   SpO2 98%   BMI 22.83 kg/m    General: no acute distress , A&Ox3 HEENT: PEERL, no proptosis or lid lag, conjunctiva normal, Oropharynx moist,neck is supple without goiter or thyromegaly or thyroid nodules Cardiovascular:  RRR without murmur or gallop. No peripheral edema Respiratory:  Good breath sounds bilaterally, CTAB with normal respiratory effort Skin:  Warm, no rashes Neuro: no tremor     Commons side effects, risks, benefits, and alternatives for medications and treatment plan prescribed today were discussed, and the patient expressed understanding of the given instructions. Patient is instructed to call or message via MyChart if he/she has any questions or concerns regarding our treatment plan. No barriers to understanding were identified. We discussed Red Flag symptoms and signs in detail. Patient expressed understanding regarding what to do in case of urgent or emergency type symptoms.   Medication list was reconciled, printed and provided to the patient in AVS. Patient instructions and summary information was  reviewed with the patient as documented in the AVS. This note was prepared with assistance of Dragon voice recognition software. Occasional wrong-word or sound-a-like substitutions may have occurred due to the inherent limitations of voice recognition software

## 2020-03-12 NOTE — Telephone Encounter (Signed)
Pt called and she wants to know results of her monitor that she wore

## 2020-03-12 NOTE — Patient Instructions (Signed)
Please return in 3 months for recheck. Also will need a lab visit in 6 weeks to recheck your thyroid level.   I will release your lab results to you on your MyChart account with further instructions. Please reply with any questions.  I will call you with samples of a medication for your lungs.   If you have any questions or concerns, please don't hesitate to send me a message via MyChart or call the office at [phone]. Thank you for visiting with [place] today! It's our pleasure caring for you.

## 2020-03-14 ENCOUNTER — Telehealth: Payer: Self-pay | Admitting: *Deleted

## 2020-03-14 NOTE — Telephone Encounter (Signed)
Patient will check with her family tonight and call me back about scheduling. Patient was informed she will need to have COVID testing prior to her PFT's

## 2020-03-14 NOTE — Telephone Encounter (Signed)
Follow Up  Zigmund Daniel could you please assist with scheduling PFT?

## 2020-03-27 NOTE — Telephone Encounter (Signed)
Spoke with patient to schedule PFT's that have been ordered---pt states she is gong to see a new doctor and will call if she need to schedule.

## 2020-03-28 ENCOUNTER — Other Ambulatory Visit: Payer: Self-pay | Admitting: Family Medicine

## 2020-04-03 ENCOUNTER — Other Ambulatory Visit: Payer: Self-pay | Admitting: Family Medicine

## 2020-04-03 NOTE — Telephone Encounter (Signed)
Pt called following up on medication refill. Please advise.  

## 2020-04-03 NOTE — Telephone Encounter (Signed)
[place] Database Verified LR: 01-01-2020 Qty: 62 Last office visit: 03-12-2020 Upcoming appointment: No pending appt

## 2020-04-10 DIAGNOSIS — F411 Generalized anxiety disorder: Secondary | ICD-10-CM | POA: Diagnosis not present

## 2020-04-10 DIAGNOSIS — G4700 Insomnia, unspecified: Secondary | ICD-10-CM | POA: Diagnosis not present

## 2020-04-10 DIAGNOSIS — J449 Chronic obstructive pulmonary disease, unspecified: Secondary | ICD-10-CM | POA: Diagnosis not present

## 2020-04-10 DIAGNOSIS — K5900 Constipation, unspecified: Secondary | ICD-10-CM | POA: Diagnosis not present

## 2020-05-07 ENCOUNTER — Other Ambulatory Visit: Payer: Self-pay | Admitting: Family Medicine

## 2020-06-05 ENCOUNTER — Other Ambulatory Visit: Payer: Self-pay | Admitting: Family Medicine

## 2020-08-07 DIAGNOSIS — F5101 Primary insomnia: Secondary | ICD-10-CM | POA: Diagnosis not present

## 2020-08-07 DIAGNOSIS — E039 Hypothyroidism, unspecified: Secondary | ICD-10-CM | POA: Diagnosis not present

## 2020-08-07 DIAGNOSIS — F411 Generalized anxiety disorder: Secondary | ICD-10-CM | POA: Diagnosis not present

## 2020-08-07 DIAGNOSIS — J441 Chronic obstructive pulmonary disease with (acute) exacerbation: Secondary | ICD-10-CM | POA: Diagnosis not present

## 2020-08-07 DIAGNOSIS — I10 Essential (primary) hypertension: Secondary | ICD-10-CM | POA: Diagnosis not present

## 2020-08-21 ENCOUNTER — Ambulatory Visit (INDEPENDENT_AMBULATORY_CARE_PROVIDER_SITE_OTHER): Payer: Medicare Other | Admitting: Cardiovascular Disease

## 2020-08-21 ENCOUNTER — Other Ambulatory Visit: Payer: Self-pay

## 2020-08-21 ENCOUNTER — Encounter: Payer: Self-pay | Admitting: Cardiovascular Disease

## 2020-08-21 DIAGNOSIS — I10 Essential (primary) hypertension: Secondary | ICD-10-CM | POA: Diagnosis not present

## 2020-08-21 DIAGNOSIS — E782 Mixed hyperlipidemia: Secondary | ICD-10-CM

## 2020-08-21 DIAGNOSIS — I480 Paroxysmal atrial fibrillation: Secondary | ICD-10-CM

## 2020-08-21 NOTE — Progress Notes (Signed)
08/21/2020 Tammy Harding   [birth year]  [id number]  Primary Physician Leamon Arnt, Harding Primary Cardiologist: Tammy Harding FACP, [place], [place], [place]  HPI:  Tammy Harding is a 86 y.o.  moderately overweight recently widowed (husband of 60 years died in 2018/02/16) mother of 12, grandmother of 2 grandchildren who recently relocated from [place] down to [place] to be closer to her son.  She was referred by Dr. Billey Chang to be established because of history of PAF and hypertension.  09/21/2018. Her risk factors only include hypertension.  She is never smoked nor she diabetic.  She is never had a heart attack or stroke.  There is no family history of heart disease.  She had a remote history of PAF on amiodarone but is not on oral anticoagulation.  Her blood pressure is well controlled on her current medications.  She has a remote history of thyroid cancer back in 1979.  She did see Sande Rives, PA-C virtually on 02/02/2020 with some complaints of dyspnea especially wearing a facemask and being on the heat.  Event monitor showed no evidence of A. fib with occasional PACs and short runs of nonsustained ventricular tachycardia.   Current Meds  Medication Sig  . albuterol (VENTOLIN HFA) 108 (90 Base) MCG/ACT inhaler Inhale 2 puffs into the lungs every 4 (four) hours as needed for wheezing or shortness of breath.  . ALPRAZolam (XANAX) 0.5 MG tablet TAKE 1/2 TABLET BY MOUTH AT BEDTIME AS NEEDED FOR ANXIETY  . aspirin 81 MG chewable tablet Chew 81 mg by mouth daily.  . carvedilol (COREG) 12.5 MG tablet TAKE 1 TABLET BY MOUTH 2 TIMES DAILY.  . cholecalciferol (VITAMIN D3) 25 MCG (1000 UT) tablet Take 1,000 Units by mouth daily.  . cloNIDine (CATAPRES) 0.1 MG tablet TAKE 1 TABLET BY MOUTH THREE TIMES A DAY  . hydrALAZINE (APRESOLINE) 25 MG tablet Take 1 tablet (25 mg total) by mouth 3 (three) times daily.  Marland Kitchen Hyoscyamine Sulfate SL (LEVSIN/SL) 0.125 MG SUBL Place 0.125 mg under  the tongue 3 (three) times daily with meals as needed (abdominal cramping or bloating).  Marland Kitchen levothyroxine (SYNTHROID) 137 MCG tablet TAKE 1 TABLET BY MOUTH DAILY.  Marland Kitchen PARoxetine (PAXIL) 10 MG tablet TAKE 1 TABLET BY MOUTH EVERY DAY     Allergies  Allergen Reactions  . Penicillins Swelling  . Amlodipine Swelling  . Bee Venom   . Hctz [Hydrochlorothiazide] Swelling  . Horse Epithelium   . Lisinopril   . Losartan     Social History   Socioeconomic History  . Marital status: Widowed    Spouse name: Not on file  . Number of children: 1  . Years of education: Not on file  . Highest education level: Not on file  Occupational History  . Not on file  Tobacco Use  . Smoking status: Never Smoker  . Smokeless tobacco: Never Used  Vaping Use  . Vaping Use: Never used  Substance and Sexual Activity  . Alcohol use: Never  . Drug use: Never  . Sexual activity: Not Currently  Other Topics Concern  . Not on file  Social History Narrative   Assisted as needed by son Tammy Harding    Social Determinants of Health   Financial Resource Strain:   . Difficulty of Paying Living Expenses: Not on file  Food Insecurity:   . Worried About Charity fundraiser in the Last Year: Not on file  . Ran Out of  Food in the Last Year: Not on file  Transportation Needs:   . Lack of Transportation (Medical): Not on file  . Lack of Transportation (Non-Medical): Not on file  Physical Activity:   . Days of Exercise per Week: Not on file  . Minutes of Exercise per Session: Not on file  Stress:   . Feeling of Stress : Not on file  Social Connections:   . Frequency of Communication with Friends and Family: Not on file  . Frequency of Social Gatherings with Friends and Family: Not on file  . Attends Religious Services: Not on file  . Active Member of Clubs or Organizations: Not on file  . Attends Archivist Meetings: Not on file  . Marital Status: Not on file  Intimate Partner Violence:   . Fear  of Current or Ex-Partner: Not on file  . Emotionally Abused: Not on file  . Physically Abused: Not on file  . Sexually Abused: Not on file     Review of Systems: General: negative for chills, fever, night sweats or weight changes.  Cardiovascular: negative for chest pain, dyspnea on exertion, edema, orthopnea, palpitations, paroxysmal nocturnal dyspnea or shortness of breath Dermatological: negative for rash Respiratory: negative for cough or wheezing Urologic: negative for hematuria Abdominal: negative for nausea, vomiting, diarrhea, bright red blood per rectum, melena, or hematemesis Neurologic: negative for visual changes, syncope, or dizziness All other systems reviewed and are otherwise negative except as noted above.    Blood pressure (!) 201/101, pulse 61, height 5\' 10"  (1.778 m), weight 155 lb (70.3 kg), SpO2 96 %.  General appearance: alert and no distress Neck: no adenopathy, no carotid bruit, no JVD, supple, symmetrical, trachea midline and thyroid not enlarged, symmetric, no tenderness/mass/nodules Lungs: clear to auscultation bilaterally Heart: regular rate and rhythm, S1, S2 normal, no murmur, click, rub or gallop Extremities: extremities normal, atraumatic, no cyanosis or edema Pulses: 2+ and symmetric Skin: Skin color, texture, turgor normal. No rashes or lesions Neurologic: Alert and oriented X 3, normal strength and tone. Normal symmetric reflexes. Normal coordination and gait  EKG sinus rhythm at 61 without ST or T wave changes.  I personally reviewed this EKG.  ASSESSMENT AND PLAN:   Essential hypertension History of essential hypertension a blood pressure measured today at 201/101 although she does admit to being somewhat stressed this morning.  She is on carvedilol, clonidine and hydralazine.  Paroxysmal atrial fibrillation (HCC) History of paroxysmal A. fib in the past maintaining sinus rhythm.  She did have a event monitor back in March that showed no  evidence of PAF.  Mixed hyperlipidemia History of hyperlipidemia not on statin therapy with lipid profile performed 02/17/2020 revealing total cholesterol 180, LDL 100 and HDL of 66.      Tammy Harding [facility], [facility] 08/21/2020 12:54 PM

## 2020-08-21 NOTE — Assessment & Plan Note (Signed)
History of essential hypertension a blood pressure measured today at 201/101 although she does admit to being somewhat stressed this morning.  She is on carvedilol, clonidine and hydralazine.

## 2020-08-21 NOTE — Patient Instructions (Signed)
Medication Instructions:  No Changes In Medications at this time.  *If you need a refill on your cardiac medications before your next appointment, please call your pharmacy*  Lab Work: None Ordered At This Time.  If you have labs (blood work) drawn today and your tests are completely normal, you will receive your results only by: Marland Kitchen MyChart Message (if you have MyChart) OR . A paper copy in the mail If you have any lab test that is abnormal or we need to change your treatment, we will call you to review the results.  Testing/Procedures: None Ordered At This Time.   Follow-Up: At [facility], you and your health needs are our priority.  As part of our continuing mission to provide you with exceptional heart care, we have created designated Provider Care Teams.  These Care Teams include your primary Cardiologist (physician) and Advanced Practice Providers (APPs -  Physician Assistants and Nurse Practitioners) who all work together to provide you with the care you need, when you need it.  Your next appointment:   6 month(s)  The format for your next appointment:   In Person  Provider:   You will see one of the following Advanced Practice Providers on your designated Care Team:    Sande Rives, PA-C  Then, Quay Burow, MD will plan to see you again in 12 month(s).

## 2020-08-21 NOTE — Assessment & Plan Note (Signed)
History of hyperlipidemia not on statin therapy with lipid profile performed 02/17/2020 revealing total cholesterol 180, LDL 100 and HDL of 66.

## 2020-08-21 NOTE — Assessment & Plan Note (Signed)
History of paroxysmal A. fib in the past maintaining sinus rhythm.  She did have a event monitor back in March that showed no evidence of PAF.

## 2020-09-05 ENCOUNTER — Other Ambulatory Visit: Payer: Self-pay | Admitting: Family Medicine

## 2020-09-07 ENCOUNTER — Other Ambulatory Visit: Payer: Self-pay | Admitting: Family Medicine

## 2020-09-14 DIAGNOSIS — E890 Postprocedural hypothyroidism: Secondary | ICD-10-CM | POA: Diagnosis not present

## 2020-09-14 DIAGNOSIS — R1084 Generalized abdominal pain: Secondary | ICD-10-CM | POA: Diagnosis not present

## 2020-09-14 DIAGNOSIS — R509 Fever, unspecified: Secondary | ICD-10-CM | POA: Diagnosis not present

## 2020-09-17 DIAGNOSIS — R509 Fever, unspecified: Secondary | ICD-10-CM | POA: Diagnosis not present

## 2020-09-17 DIAGNOSIS — R1084 Generalized abdominal pain: Secondary | ICD-10-CM | POA: Diagnosis not present

## 2020-09-19 ENCOUNTER — Other Ambulatory Visit: Payer: Self-pay | Admitting: Family Medicine

## 2020-10-02 DIAGNOSIS — N2889 Other specified disorders of kidney and ureter: Secondary | ICD-10-CM | POA: Diagnosis not present

## 2020-11-20 ENCOUNTER — Other Ambulatory Visit: Payer: Self-pay | Admitting: Family Medicine

## 2020-11-29 ENCOUNTER — Other Ambulatory Visit: Payer: Self-pay | Admitting: Family Medicine

## 2020-12-02 ENCOUNTER — Other Ambulatory Visit: Payer: Self-pay | Admitting: Family Medicine

## 2021-01-15 DIAGNOSIS — I10 Essential (primary) hypertension: Secondary | ICD-10-CM | POA: Diagnosis not present

## 2021-01-15 DIAGNOSIS — Z0000 Encounter for general adult medical examination without abnormal findings: Secondary | ICD-10-CM | POA: Diagnosis not present

## 2021-01-15 DIAGNOSIS — F411 Generalized anxiety disorder: Secondary | ICD-10-CM | POA: Diagnosis not present

## 2021-01-15 DIAGNOSIS — E890 Postprocedural hypothyroidism: Secondary | ICD-10-CM | POA: Diagnosis not present

## 2021-01-22 DIAGNOSIS — H348322 Tributary (branch) retinal vein occlusion, left eye, stable: Secondary | ICD-10-CM | POA: Diagnosis not present

## 2021-01-22 DIAGNOSIS — H26491 Other secondary cataract, right eye: Secondary | ICD-10-CM | POA: Diagnosis not present

## 2021-01-22 DIAGNOSIS — H353193 Nonexudative age-related macular degeneration, unspecified eye, advanced atrophic without subfoveal involvement: Secondary | ICD-10-CM | POA: Diagnosis not present

## 2021-01-29 DIAGNOSIS — H25812 Combined forms of age-related cataract, left eye: Secondary | ICD-10-CM | POA: Diagnosis not present

## 2021-01-29 DIAGNOSIS — Z01818 Encounter for other preprocedural examination: Secondary | ICD-10-CM | POA: Diagnosis not present

## 2021-01-29 DIAGNOSIS — H34832 Tributary (branch) retinal vein occlusion, left eye, with macular edema: Secondary | ICD-10-CM | POA: Diagnosis not present

## 2021-01-29 DIAGNOSIS — H26491 Other secondary cataract, right eye: Secondary | ICD-10-CM | POA: Diagnosis not present

## 2021-02-06 DIAGNOSIS — H35372 Puckering of macula, left eye: Secondary | ICD-10-CM | POA: Diagnosis not present

## 2021-02-06 DIAGNOSIS — H353132 Nonexudative age-related macular degeneration, bilateral, intermediate dry stage: Secondary | ICD-10-CM | POA: Diagnosis not present

## 2021-02-06 DIAGNOSIS — H34832 Tributary (branch) retinal vein occlusion, left eye, with macular edema: Secondary | ICD-10-CM | POA: Diagnosis not present

## 2021-02-14 DIAGNOSIS — H25812 Combined forms of age-related cataract, left eye: Secondary | ICD-10-CM | POA: Diagnosis not present

## 2021-02-14 DIAGNOSIS — H2512 Age-related nuclear cataract, left eye: Secondary | ICD-10-CM | POA: Diagnosis not present

## 2021-02-26 DIAGNOSIS — E039 Hypothyroidism, unspecified: Secondary | ICD-10-CM | POA: Diagnosis not present

## 2021-03-06 ENCOUNTER — Other Ambulatory Visit: Payer: Self-pay | Admitting: Family Medicine

## 2021-03-14 ENCOUNTER — Other Ambulatory Visit: Payer: Self-pay | Admitting: Family Medicine

## 2021-04-10 DIAGNOSIS — J208 Acute bronchitis due to other specified organisms: Secondary | ICD-10-CM | POA: Diagnosis not present

## 2021-04-10 DIAGNOSIS — B9689 Other specified bacterial agents as the cause of diseases classified elsewhere: Secondary | ICD-10-CM | POA: Diagnosis not present

## 2021-04-10 DIAGNOSIS — U071 COVID-19: Secondary | ICD-10-CM | POA: Diagnosis not present

## 2021-05-01 DIAGNOSIS — J441 Chronic obstructive pulmonary disease with (acute) exacerbation: Secondary | ICD-10-CM | POA: Diagnosis not present

## 2021-05-01 DIAGNOSIS — Z8709 Personal history of other diseases of the respiratory system: Secondary | ICD-10-CM | POA: Diagnosis not present

## 2021-05-01 DIAGNOSIS — J309 Allergic rhinitis, unspecified: Secondary | ICD-10-CM | POA: Diagnosis not present

## 2021-05-03 ENCOUNTER — Emergency Department (HOSPITAL_COMMUNITY)
Admission: EM | Admit: 2021-05-03 | Discharge: 2021-05-03 | Disposition: A | Discharge status: Home / Self Care | Payer: Medicare Other | Attending: Emergency Medicine | Admitting: Emergency Medicine

## 2021-05-03 ENCOUNTER — Encounter (HOSPITAL_COMMUNITY): Payer: Self-pay

## 2021-05-03 ENCOUNTER — Emergency Department (HOSPITAL_COMMUNITY): Payer: Medicare Other

## 2021-05-03 ENCOUNTER — Other Ambulatory Visit: Payer: Self-pay

## 2021-05-03 DIAGNOSIS — R0789 Other chest pain: Secondary | ICD-10-CM | POA: Diagnosis not present

## 2021-05-03 DIAGNOSIS — Z79899 Other long term (current) drug therapy: Secondary | ICD-10-CM | POA: Diagnosis not present

## 2021-05-03 DIAGNOSIS — Z7982 Long term (current) use of aspirin: Secondary | ICD-10-CM | POA: Diagnosis not present

## 2021-05-03 DIAGNOSIS — I10 Essential (primary) hypertension: Secondary | ICD-10-CM | POA: Diagnosis not present

## 2021-05-03 DIAGNOSIS — Z8616 Personal history of COVID-19: Secondary | ICD-10-CM | POA: Insufficient documentation

## 2021-05-03 DIAGNOSIS — R918 Other nonspecific abnormal finding of lung field: Secondary | ICD-10-CM | POA: Diagnosis not present

## 2021-05-03 DIAGNOSIS — R042 Hemoptysis: Secondary | ICD-10-CM | POA: Diagnosis not present

## 2021-05-03 DIAGNOSIS — R079 Chest pain, unspecified: Secondary | ICD-10-CM | POA: Diagnosis not present

## 2021-05-03 DIAGNOSIS — E039 Hypothyroidism, unspecified: Secondary | ICD-10-CM | POA: Insufficient documentation

## 2021-05-03 DIAGNOSIS — Z85850 Personal history of malignant neoplasm of thyroid: Secondary | ICD-10-CM | POA: Insufficient documentation

## 2021-05-03 DIAGNOSIS — J45909 Unspecified asthma, uncomplicated: Secondary | ICD-10-CM | POA: Insufficient documentation

## 2021-05-03 DIAGNOSIS — I491 Atrial premature depolarization: Secondary | ICD-10-CM | POA: Diagnosis not present

## 2021-05-03 DIAGNOSIS — J449 Chronic obstructive pulmonary disease, unspecified: Secondary | ICD-10-CM | POA: Diagnosis not present

## 2021-05-03 HISTORY — DX: COVID-19: U07.1

## 2021-05-03 LAB — BASIC METABOLIC PANEL
Anion gap: 7 (ref 5–15)
BUN: 14 mg/dL (ref 8–23)
CO2: 30 mmol/L (ref 22–32)
Calcium: 9.1 mg/dL (ref 8.9–10.3)
Chloride: 91 mmol/L — ABNORMAL LOW (ref 98–111)
Creatinine, Ser: 0.9 mg/dL (ref 0.44–1.00)
GFR, Estimated: >60 mL/min (ref >60)
Glucose, Bld: 110 mg/dL — ABNORMAL HIGH (ref 70–99)
Potassium: 3.8 mmol/L (ref 3.5–5.1)
Sodium: 128 mmol/L — ABNORMAL LOW (ref 135–145)

## 2021-05-03 LAB — CBC
HCT: 37.6 % (ref 36.0–46.0)
Hemoglobin: 12.4 g/dL (ref 12.0–15.0)
MCH: 27.1 pg (ref 26.0–34.0)
MCHC: 33 g/dL (ref 30.0–36.0)
MCV: 82.3 fL (ref 80.0–100.0)
Platelets: 207 10*3/uL (ref 150–400)
RBC: 4.57 MIL/uL (ref 3.87–5.11)
RDW: 14 % (ref 11.5–15.5)
WBC: 7.7 10*3/uL (ref 4.0–10.5)
nRBC: 0 % (ref 0.0–0.2)

## 2021-05-03 LAB — TROPONIN I (HIGH SENSITIVITY): Troponin I (High Sensitivity): 9 ng/L (ref <18)

## 2021-05-03 MED ORDER — LIDOCAINE 5 % EX PTCH
1.0000 | MEDICATED_PATCH | CUTANEOUS | Status: DC
  Administered 2021-05-03: 1 via TRANSDERMAL
  Filled 2021-05-03: qty 1

## 2021-05-03 MED ORDER — OXYCODONE HCL 5 MG PO TABS: 5.0000 mg | ORAL_TABLET | ORAL | 0 refills | Status: AC | PRN

## 2021-05-03 MED ORDER — BENZONATATE 100 MG PO CAPS
200.0000 mg | ORAL_CAPSULE | Freq: Once | ORAL | Status: AC
  Administered 2021-05-03: 200 mg via ORAL
  Filled 2021-05-03: qty 2

## 2021-05-03 MED ORDER — BENZONATATE 200 MG PO CAPS: 200.0000 mg | ORAL_CAPSULE | Freq: Three times a day (TID) | ORAL | 0 refills | Status: AC

## 2021-05-03 MED ORDER — LIDOCAINE 5 % EX PTCH: 1.0000 | MEDICATED_PATCH | CUTANEOUS | 0 refills | Status: AC

## 2021-05-03 MED ORDER — OXYCODONE HCL 5 MG PO TABS
5.0000 mg | ORAL_TABLET | Freq: Once | ORAL | Status: AC
  Administered 2021-05-03: 5 mg via ORAL
  Filled 2021-05-03: qty 1

## 2021-05-03 NOTE — ED Triage Notes (Addendum)
Pt arrived via [facility] from home for c/o "beating" in left lower rib area. Pt states it's painful when she lays flat. Pt states she went to [facility] and they found a nodule under her left breast and she came here for a f/u for that. Per EMS pt had COVID 3 wks ago. Pt denies N/V, but states has "some" SOB. Pt is A&Ox4. Pt is hypertensive and states she did take her bp meds today. Pt is A-fib on monitor.

## 2021-05-03 NOTE — ED Provider Notes (Signed)
[facility] EMERGENCY DEPARTMENT Provider Note   CSN: [id number] Arrival date & time: 05/03/21  1053     History Chief Complaint  Patient presents with  . Chest Pain    Tammy Harding is a 87 y.o. female.  87 year old female with history of hypertension, A. fib, brought in by son from home with complaint of left lower chest pain x3 weeks onset when she tested positive for COVID at home.  Pain has been constant, not worsening however not improving.  Patient presented to her PCP office yesterday and was given prednisone which she feels like she is experiencing side effects from and did not sleep well last night.  Reports shortness of breath however states this is baseline.  States that she did cough up a little blood today which is new for her, takes a baby aspirin daily.  Has felt feverish at times however has not checked her temperature.  Denies lower extremity edema or calf pain, no history of PE or DVT, no history of CHF.  Left anterior chest wall pain is worse with palpation as well as lying supine.        Past Medical History:  Diagnosis Date  . Allergy   . Anxiety   . Arthritis   . Chronic anxiety 08/19/2018  . Chronic atrial fibrillation ([place]) 08/19/2018  . COVID-19   . Depression   . Essential hypertension 08/19/2018  . Thyroid cancer [facility])     Patient Active Problem List   Diagnosis Date Noted  . Long term prescription benzodiazepine use 10/12/2018  . Postoperative hypothyroidism 08/19/2018  . History of thyroid cancer 08/19/2018  . Lung nodule 09/02/2016  . Paroxysmal atrial fibrillation ([place]) 08/30/2015  . Mixed hyperlipidemia 02/27/2015  . Essential hypertension 08/29/2014  . Osteoporosis 04/03/2014  . Vitamin D deficiency 06/24/2012  . Insomnia 08/13/2010  . IBS (irritable bowel syndrome) 08/07/2009  . Asthma-chronic obstructive pulmonary disease overlap syndrome ([place]) 05/22/2009  . Gout 12/21/2008  . Allergic rhinitis due to pollen  03/27/2005  . Esophageal reflux 05/03/2004  . Generalized anxiety disorder 04/23/1999    Past Surgical History:  Procedure Laterality Date  . APPENDECTOMY    . THYROIDECTOMY     1979  . TONSILLECTOMY AND ADENOIDECTOMY       OB History   No obstetric history on file.     Family History  Problem Relation Age of Onset  . Healthy Son   . Arthritis Mother   . Hypertension Mother   . Kidney disease Mother   . Arthritis Father   . Heart attack Father   . Hypertension Sister   . Depression Sister   . Hypertension Sister     Social History   Tobacco Use  . Smoking status: Never Smoker  . Smokeless tobacco: Never Used  Vaping Use  . Vaping Use: Never used  Substance Use Topics  . Alcohol use: Never  . Drug use: Never    Home Medications Prior to Admission medications   Medication Sig Start Date End Date Taking? Authorizing Provider  benzonatate (TESSALON) 200 MG capsule Take 1 capsule (200 mg total) by mouth every 8 (eight) hours for 10 days. 05/03/21 05/13/21 Yes Tacy Learn, PA-C  lidocaine (LIDODERM) 5 % Place 1 patch onto the skin daily. Remove & Discard patch within 12 hours or as directed by MD 05/03/21  Yes Tacy Learn, PA-C  oxyCODONE (ROXICODONE) 5 MG immediate release tablet Take 1 tablet (5 mg total) by mouth every 4 (  four) hours as needed for severe pain. 05/03/21  Yes Tacy Learn, PA-C  albuterol (VENTOLIN HFA) 108 (90 Base) MCG/ACT inhaler INHALE 2 PUFFS INTO THE LUNGS EVERY 4 (FOUR) HOURS AS NEEDED FOR WHEEZING OR SHORTNESS OF BREATH. 12/04/20   Leamon Arnt, MD  ALPRAZolam Duanne Moron) 0.5 MG tablet TAKE 1/2 TABLET BY MOUTH AT BEDTIME AS NEEDED FOR ANXIETY 04/03/20   Leamon Arnt, MD  aspirin 81 MG chewable tablet Chew 81 mg by mouth daily.    [provider]  carvedilol (COREG) 12.5 MG tablet TAKE 1 TABLET BY MOUTH 2 TIMES DAILY. 03/07/21   Marin Olp, MD  cholecalciferol (VITAMIN D3) 25 MCG (1000 UT) tablet Take 1,000 Units by mouth daily.     [provider]  cloNIDine (CATAPRES) 0.1 MG tablet TAKE 1 TABLET BY MOUTH THREE TIMES A DAY 03/07/21   Marin Olp, MD  hydrALAZINE (APRESOLINE) 25 MG tablet Take 1 tablet (25 mg total) by mouth 3 (three) times daily. 03/12/20   Leamon Arnt, MD  Hyoscyamine Sulfate SL (LEVSIN/SL) 0.125 MG SUBL Place 0.125 mg under the tongue 3 (three) times daily with meals as needed (abdominal cramping or bloating). 10/18/19   Leamon Arnt, MD  levothyroxine (SYNTHROID) 137 MCG tablet Take 1 tablet (137 mcg total) by mouth daily. PLEASE SCHEDULE FOLLOW UP WITH DR. Jonni Sanger FOR FURTHER REFILLS [phone] 09/09/20   Leamon Arnt, MD  PARoxetine (PAXIL) 10 MG tablet TAKE 1 TABLET BY MOUTH EVERY DAY 02/15/20   Leamon Arnt, MD    Allergies    Penicillins, Amlodipine, Bee venom, Hctz [hydrochlorothiazide], Horse epithelium, Lisinopril, and Losartan  Review of Systems   Review of Systems  Constitutional: Positive for chills. Negative for fever.  HENT: Positive for congestion.   Respiratory: Positive for cough and shortness of breath.   Cardiovascular: Positive for chest pain. Negative for palpitations and leg swelling.  Gastrointestinal: Negative for abdominal pain, constipation, diarrhea, nausea and vomiting.  Musculoskeletal: Negative for arthralgias and joint swelling.  Skin: Negative for rash and wound.  Allergic/Immunologic: Negative for immunocompromised state.  Neurological: Negative for weakness.  Psychiatric/Behavioral: Negative for confusion.  All other systems reviewed and are negative.   Physical Exam Updated Vital Signs BP 139/81   Pulse 68   Temp 98.3 F (36.8 C) (Oral)   Resp 20   Ht 5\' 10"  (1.778 m)   Wt 70.3 kg   SpO2 93%   BMI 22.24 kg/m   Physical Exam Vitals and nursing note reviewed.  Constitutional:      General: She is not in acute distress.    Appearance: She is well-developed. She is not diaphoretic.  HENT:     Head: Normocephalic and  atraumatic.  Cardiovascular:     Rate and Rhythm: Normal rate and regular rhythm.     Heart sounds: Normal heart sounds. No murmur heard.   Pulmonary:     Effort: Pulmonary effort is normal.     Breath sounds: Normal breath sounds.  Chest:     Chest wall: Tenderness present.    Abdominal:     Palpations: Abdomen is soft.     Tenderness: There is no abdominal tenderness.  Musculoskeletal:     Right lower leg: No tenderness. No edema.     Left lower leg: No tenderness. No edema.  Skin:    General: Skin is warm and dry.     Findings: No erythema or rash.  Neurological:     Mental  Status: She is alert and oriented to person, place, and time.  Psychiatric:        Behavior: Behavior normal.     ED Results / Procedures / Treatments   Labs (all labs ordered are listed, but only abnormal results are displayed) Labs Reviewed  BASIC METABOLIC PANEL - Abnormal; Notable for the following components:      Result Value   Sodium 128 (*)    Chloride 91 (*)    Glucose, Bld 110 (*)    All other components within normal limits  CBC  TROPONIN I (HIGH SENSITIVITY)    EKG EKG Interpretation  Date/Time:  Friday May 03 2021 10:55:23 EDT Ventricular Rate:  69 PR Interval:  57 QRS Duration: 78 QT Interval:  411 QTC Calculation: 441 R Axis:   76 Text Interpretation: Sinus rhythm Short PR interval Nonspecific T abnrm, anterolateral leads Confirmed by Lacretia Leigh ([id number]) on 05/03/2021 11:04:52 AM   Radiology DG Chest 2 View  Result Date: 05/03/2021 CLINICAL DATA:  Chest pain. EXAM: CHEST - 2 VIEW COMPARISON:  January 27, 2019 FINDINGS: The heart size and mediastinal contours are within normal limits. No consolidation. Approximately 6 mm nodular opacity in the lateral right midlung appears similar to the prior. No visible pleural effusions or pneumothorax. Similar height loss of a lumbar vertebral body. No acute osseous abnormality. Osteopenia. IMPRESSION: 1. No evidence of acute  cardiopulmonary disease. 2. Approximately 6 mm nodular opacity in the lateral right midlung appears similar to the prior from 2020. Electronically Signed   By: Margaretha Sheffield MD   On: 05/03/2021 11:53    Procedures Procedures   Medications Ordered in ED Medications  lidocaine (LIDODERM) 5 % 1 patch (1 patch Transdermal Patch Applied 05/03/21 1440)  oxyCODONE (Oxy IR/ROXICODONE) immediate release tablet 5 mg (5 mg Oral Given 05/03/21 1441)  benzonatate (TESSALON) capsule 200 mg (200 mg Oral Given 05/03/21 1441)    ED Course  I have reviewed the triage vital signs and the nursing notes.  Pertinent labs & imaging results that were available during my care of the patient were reviewed by me and considered in my medical decision making (see chart for details).  Clinical Course as of 05/03/21 1456  Fri May 03, 3145  [id number] 87 year old female with left anterior/midclavicular pain x 3 weeks with recent COVID diagnosis. TTP on exam without crepitus or skin changes. Abdomen soft and non tender. Found to be hypertensive on arrival, BP improved with time/rest. CBC WNL, BMP with hyponatremia, discussed with Dr. Zenia Resides, Er attending, pt to follow up with PCP.  Troponin 9, doubt cardiac source with pain x 3 weeks.  CXR with nodule, no significant change from prior.  Given Lidoderm patch, oxycodone (norco shortage), tessalon for cough/sore throat. Recheck with PCP, return to ER for new or worsening symptoms.  [LM]    Clinical Course User Index [LM] Roque Lias   MDM Rules/Calculators/A&P                         Final Clinical Impression(s) / ED Diagnoses Final diagnoses:  Chest wall pain    Rx / DC Orders ED Discharge Orders         Ordered    benzonatate (TESSALON) 200 MG capsule  Every 8 hours        05/03/21 1450    lidocaine (LIDODERM) 5 %  Every 24 hours        05/03/21 1450  oxyCODONE (ROXICODONE) 5 MG immediate release tablet  Every 4 hours PRN        05/03/21 1450            Roque Lias 05/03/21 1456    Lacretia Leigh, MD 05/05/21 [phone]

## 2021-05-03 NOTE — ED Notes (Signed)
Patient transported to X-ray 

## 2021-05-03 NOTE — Discharge Instructions (Addendum)
Follow-up with your primary care provider for recheck next week.  Return to the emergency room for new or worsening symptoms.   Take Tessalon as prescribed as needed for cough. Apply Lidoderm patch to area as needed as prescribed. Take Oxycodone as needed as prescribed for pain not controlled with Tylenol and above medications. Oxycodone can cause constipation, take stool softener as needed.

## 2021-05-15 DIAGNOSIS — H34832 Tributary (branch) retinal vein occlusion, left eye, with macular edema: Secondary | ICD-10-CM | POA: Diagnosis not present

## 2021-06-12 DIAGNOSIS — H34832 Tributary (branch) retinal vein occlusion, left eye, with macular edema: Secondary | ICD-10-CM | POA: Diagnosis not present

## 2021-06-13 DIAGNOSIS — R0602 Shortness of breath: Secondary | ICD-10-CM | POA: Diagnosis not present

## 2021-06-13 DIAGNOSIS — J00 Acute nasopharyngitis [common cold]: Secondary | ICD-10-CM | POA: Diagnosis not present

## 2021-06-13 DIAGNOSIS — I10 Essential (primary) hypertension: Secondary | ICD-10-CM | POA: Diagnosis not present

## 2021-06-13 DIAGNOSIS — F411 Generalized anxiety disorder: Secondary | ICD-10-CM | POA: Diagnosis not present

## 2021-07-18 DIAGNOSIS — J441 Chronic obstructive pulmonary disease with (acute) exacerbation: Secondary | ICD-10-CM | POA: Diagnosis not present

## 2021-07-18 DIAGNOSIS — E039 Hypothyroidism, unspecified: Secondary | ICD-10-CM | POA: Diagnosis not present

## 2021-07-18 DIAGNOSIS — F411 Generalized anxiety disorder: Secondary | ICD-10-CM | POA: Diagnosis not present

## 2021-07-18 DIAGNOSIS — I10 Essential (primary) hypertension: Secondary | ICD-10-CM | POA: Diagnosis not present

## 2021-07-18 DIAGNOSIS — R0602 Shortness of breath: Secondary | ICD-10-CM | POA: Diagnosis not present

## 2021-07-24 DIAGNOSIS — H34832 Tributary (branch) retinal vein occlusion, left eye, with macular edema: Secondary | ICD-10-CM | POA: Diagnosis not present

## 2021-08-21 DIAGNOSIS — H34832 Tributary (branch) retinal vein occlusion, left eye, with macular edema: Secondary | ICD-10-CM | POA: Diagnosis not present

## 2021-09-18 DIAGNOSIS — H353132 Nonexudative age-related macular degeneration, bilateral, intermediate dry stage: Secondary | ICD-10-CM | POA: Diagnosis not present

## 2021-09-18 DIAGNOSIS — H35372 Puckering of macula, left eye: Secondary | ICD-10-CM | POA: Diagnosis not present

## 2021-09-18 DIAGNOSIS — H34832 Tributary (branch) retinal vein occlusion, left eye, with macular edema: Secondary | ICD-10-CM | POA: Diagnosis not present

## 2021-10-23 DIAGNOSIS — H353132 Nonexudative age-related macular degeneration, bilateral, intermediate dry stage: Secondary | ICD-10-CM | POA: Diagnosis not present

## 2021-10-23 DIAGNOSIS — H34832 Tributary (branch) retinal vein occlusion, left eye, with macular edema: Secondary | ICD-10-CM | POA: Diagnosis not present

## 2021-10-23 DIAGNOSIS — H35372 Puckering of macula, left eye: Secondary | ICD-10-CM | POA: Diagnosis not present

## 2021-11-01 ENCOUNTER — Other Ambulatory Visit: Payer: Self-pay | Admitting: Family Medicine

## 2021-11-26 DIAGNOSIS — J449 Chronic obstructive pulmonary disease, unspecified: Secondary | ICD-10-CM | POA: Diagnosis not present

## 2021-11-26 DIAGNOSIS — R059 Cough, unspecified: Secondary | ICD-10-CM | POA: Diagnosis not present

## 2021-11-26 DIAGNOSIS — J329 Chronic sinusitis, unspecified: Secondary | ICD-10-CM | POA: Diagnosis not present

## 2022-04-11 ENCOUNTER — Emergency Department (HOSPITAL_COMMUNITY)
Admission: EM | Admit: 2022-04-11 | Discharge: 2022-04-12 | Discharge status: Against Medical Advice | Payer: Medicare Other

## 2022-04-11 NOTE — ED Notes (Signed)
Pt called for triage no response ?

## 2022-04-13 ENCOUNTER — Other Ambulatory Visit: Payer: Self-pay

## 2022-04-13 ENCOUNTER — Encounter (HOSPITAL_BASED_OUTPATIENT_CLINIC_OR_DEPARTMENT_OTHER): Payer: Self-pay | Admitting: Obstetrics and Gynecology

## 2022-04-13 ENCOUNTER — Emergency Department (HOSPITAL_BASED_OUTPATIENT_CLINIC_OR_DEPARTMENT_OTHER): Payer: Medicare Other

## 2022-04-13 ENCOUNTER — Emergency Department (HOSPITAL_BASED_OUTPATIENT_CLINIC_OR_DEPARTMENT_OTHER)
Admission: EM | Admit: 2022-04-13 | Discharge: 2022-04-13 | Disposition: A | Discharge status: Home / Self Care | Payer: Medicare Other | Attending: Emergency Medicine | Admitting: Emergency Medicine

## 2022-04-13 DIAGNOSIS — I10 Essential (primary) hypertension: Secondary | ICD-10-CM | POA: Diagnosis not present

## 2022-04-13 DIAGNOSIS — K5900 Constipation, unspecified: Secondary | ICD-10-CM | POA: Insufficient documentation

## 2022-04-13 DIAGNOSIS — Z79899 Other long term (current) drug therapy: Secondary | ICD-10-CM | POA: Diagnosis not present

## 2022-04-13 DIAGNOSIS — R1032 Left lower quadrant pain: Secondary | ICD-10-CM | POA: Diagnosis present

## 2022-04-13 DIAGNOSIS — R030 Elevated blood-pressure reading, without diagnosis of hypertension: Secondary | ICD-10-CM

## 2022-04-13 DIAGNOSIS — Z7982 Long term (current) use of aspirin: Secondary | ICD-10-CM | POA: Diagnosis not present

## 2022-04-13 DIAGNOSIS — R1084 Generalized abdominal pain: Secondary | ICD-10-CM

## 2022-04-13 DIAGNOSIS — R110 Nausea: Secondary | ICD-10-CM | POA: Diagnosis not present

## 2022-04-13 DIAGNOSIS — N2889 Other specified disorders of kidney and ureter: Secondary | ICD-10-CM | POA: Diagnosis not present

## 2022-04-13 LAB — COMPREHENSIVE METABOLIC PANEL
ALT: 10 U/L (ref 0–44)
AST: 16 U/L (ref 15–41)
Albumin: 4.4 g/dL (ref 3.5–5.0)
Alkaline Phosphatase: 72 U/L (ref 38–126)
Anion gap: 9 (ref 5–15)
BUN: 15 mg/dL (ref 8–23)
CO2: 31 mmol/L (ref 22–32)
Calcium: 9.6 mg/dL (ref 8.9–10.3)
Chloride: 93 mmol/L — ABNORMAL LOW (ref 98–111)
Creatinine, Ser: 0.96 mg/dL (ref 0.44–1.00)
GFR, Estimated: 57 mL/min — ABNORMAL LOW (ref >60)
Glucose, Bld: 186 mg/dL — ABNORMAL HIGH (ref 70–99)
Potassium: 3.8 mmol/L (ref 3.5–5.1)
Sodium: 133 mmol/L — ABNORMAL LOW (ref 135–145)
Total Bilirubin: 0.6 mg/dL (ref 0.3–1.2)
Total Protein: 7.6 g/dL (ref 6.5–8.1)

## 2022-04-13 LAB — CBC
HCT: 41.2 % (ref 36.0–46.0)
Hemoglobin: 13.3 g/dL (ref 12.0–15.0)
MCH: 26.9 pg (ref 26.0–34.0)
MCHC: 32.3 g/dL (ref 30.0–36.0)
MCV: 83.4 fL (ref 80.0–100.0)
Platelets: 227 10*3/uL (ref 150–400)
RBC: 4.94 MIL/uL (ref 3.87–5.11)
RDW: 13.7 % (ref 11.5–15.5)
WBC: 4.9 10*3/uL (ref 4.0–10.5)
nRBC: 0 % (ref 0.0–0.2)

## 2022-04-13 LAB — URINALYSIS, ROUTINE W REFLEX MICROSCOPIC
Bilirubin Urine: NEGATIVE
Glucose, UA: NEGATIVE mg/dL
Hgb urine dipstick: NEGATIVE
Ketones, ur: NEGATIVE mg/dL
Leukocytes,Ua: NEGATIVE
Nitrite: NEGATIVE
Protein, ur: 30 mg/dL — AB
Specific Gravity, Urine: 1.007 (ref 1.005–1.030)
pH: 7 (ref 5.0–8.0)

## 2022-04-13 LAB — LIPASE, BLOOD: Lipase: 16 U/L (ref 11–51)

## 2022-04-13 MED ORDER — POLYETHYLENE GLYCOL 3350 17 G PO PACK: 17.0000 g | PACK | Freq: Every day | ORAL | 0 refills | Status: AC

## 2022-04-13 MED ORDER — IOHEXOL 300 MG/ML  SOLN
100.0000 mL | Freq: Once | INTRAMUSCULAR | Status: AC | PRN
  Administered 2022-04-13: 100 mL via INTRAVENOUS

## 2022-04-13 MED ORDER — ALUM & MAG HYDROXIDE-SIMETH 200-200-20 MG/5ML PO SUSP
30.0000 mL | Freq: Once | ORAL | Status: AC
  Administered 2022-04-13: 30 mL via ORAL
  Filled 2022-04-13: qty 30

## 2022-04-13 NOTE — ED Notes (Signed)
Patient given discharge instructions. Questions were answered. Patient verbalized understanding of discharge instructions and care at home. IV removed. ? ?Discharged with family ?

## 2022-04-13 NOTE — ED Triage Notes (Signed)
Patient reports to the ER for HTN and abdominal pain. Patient reports she has been having her medications changed and is "all out of whack". Last BM this morning at 0300 was small and lots of associated gas. Patient reportedly has been constipated for most of the week.  ?

## 2022-04-13 NOTE — Discharge Instructions (Signed)
Use the MiraLAX for the next few days as discussed.  Follow-up with your primary care doctor within the next few days to recheck your blood pressure and to discuss further imaging of the nodule on your kidney.  Return to the emergency room if you have any worsening symptoms. ?

## 2022-04-13 NOTE — ED Provider Notes (Signed)
?[place] [facility] EMERGENCY DEPT ?Provider Note ? ? ?CSN: [id number] ?Arrival date & time: 04/13/22  1275 ? ?  ? ?History ? ?Chief Complaint  ?Patient presents with  ? Abdominal Pain  ? Hypertension  ? ? ?Tammy Harding is a 88 y.o. female. ? ?Patient is a 88 year old female who presents with abdominal pain.  She said she has had abdominal pain intermittently for several months.  She thought it was related to constipation.  She seen her PCP for it.  She does take hycosamine intermittently for it.  She says over the last 3 days it is gotten much worse.  She has mostly pain in her lower abdomen and across the top.  She had some nausea but no vomiting.  She says she is intermittently constipated but did have a bowel movement this morning.  No noticeable blood in her stool.  No fevers.  No urinary symptoms.  She does state that her blood pressures been up and down recently.  She thought that the extended release Cardizem may have been causing her abdominal pain.  Her PCP recently changed it from extended release to immediate release.  She was taking it twice a day and now she just increased it to 3 times a day. ? ? ?  ? ?Home Medications ?Prior to Admission medications   ?Medication Sig Start Date End Date Taking? Authorizing Provider  ?albuterol (VENTOLIN HFA) 108 (90 Base) MCG/ACT inhaler INHALE 2 PUFFS INTO THE LUNGS EVERY 4 (FOUR) HOURS AS NEEDED FOR WHEEZING OR SHORTNESS OF BREATH. 12/04/20   Leamon Arnt, MD  ?ALPRAZolam Duanne Moron) 0.5 MG tablet TAKE 1/2 TABLET BY MOUTH AT BEDTIME AS NEEDED FOR ANXIETY 04/03/20   Leamon Arnt, MD  ?aspirin 81 MG chewable tablet Chew 81 mg by mouth daily.    [provider]  ?carvedilol (COREG) 12.5 MG tablet TAKE 1 TABLET BY MOUTH 2 TIMES DAILY. 03/07/21   Marin Olp, MD  ?cholecalciferol (VITAMIN D3) 25 MCG (1000 UT) tablet Take 1,000 Units by mouth daily.    [provider]  ?cloNIDine (CATAPRES) 0.1 MG tablet TAKE 1 TABLET BY MOUTH THREE TIMES A DAY  11/01/21   Marin Olp, MD  ?hydrALAZINE (APRESOLINE) 25 MG tablet Take 1 tablet (25 mg total) by mouth 3 (three) times daily. 03/12/20   Leamon Arnt, MD  ?Hyoscyamine Sulfate SL (LEVSIN/SL) 0.125 MG SUBL Place 0.125 mg under the tongue 3 (three) times daily with meals as needed (abdominal cramping or bloating). 10/18/19   Leamon Arnt, MD  ?levothyroxine (SYNTHROID) 137 MCG tablet Take 1 tablet (137 mcg total) by mouth daily. PLEASE SCHEDULE FOLLOW UP WITH DR. Jonni Sanger FOR FURTHER REFILLS [phone] 09/09/20   Leamon Arnt, MD  ?lidocaine (LIDODERM) 5 % Place 1 patch onto the skin daily. Remove & Discard patch within 12 hours or as directed by MD 05/03/21   Tacy Learn, PA-C  ?oxyCODONE (ROXICODONE) 5 MG immediate release tablet Take 1 tablet (5 mg total) by mouth every 4 (four) hours as needed for severe pain. 05/03/21   Tacy Learn, PA-C  ?PARoxetine (PAXIL) 10 MG tablet TAKE 1 TABLET BY MOUTH EVERY DAY 02/15/20   Leamon Arnt, MD  ?   ? ?Allergies    ?Penicillins, Amlodipine, Bee venom, Hctz [hydrochlorothiazide], Horse epithelium, Lisinopril, and Losartan   ? ?Review of Systems   ?Review of Systems  ?Constitutional:  Negative for chills, diaphoresis, fatigue and fever.  ?HENT:  Negative for congestion, rhinorrhea and  sneezing.   ?Eyes: Negative.   ?Respiratory:  Negative for cough, chest tightness and shortness of breath.   ?Cardiovascular:  Negative for chest pain and leg swelling.  ?Gastrointestinal:  Positive for abdominal pain, constipation and nausea. Negative for blood in stool, diarrhea and vomiting.  ?Genitourinary:  Negative for difficulty urinating, flank pain, frequency and hematuria.  ?Musculoskeletal:  Negative for arthralgias and back pain.  ?Skin:  Negative for rash.  ?Neurological:  Negative for dizziness, speech difficulty, weakness, numbness and headaches.  ? ?Physical Exam ?Updated Vital Signs ?BP (!) 203/102 (BP Location: Right Arm)   Pulse 65   Temp 98.2 ?F (36.8 ?C)    Resp 17   SpO2 98%  ?Physical Exam ?Constitutional:   ?   Appearance: She is well-developed.  ?HENT:  ?   Head: Normocephalic and atraumatic.  ?Eyes:  ?   Pupils: Pupils are equal, round, and reactive to light.  ?Cardiovascular:  ?   Rate and Rhythm: Normal rate and regular rhythm.  ?   Heart sounds: Normal heart sounds.  ?Pulmonary:  ?   Effort: Pulmonary effort is normal. No respiratory distress.  ?   Breath sounds: Normal breath sounds. No wheezing or rales.  ?Chest:  ?   Chest wall: No tenderness.  ?Abdominal:  ?   General: Bowel sounds are normal.  ?   Palpations: Abdomen is soft.  ?   Tenderness: There is abdominal tenderness in the epigastric area and left lower quadrant. There is no guarding or rebound.  ?Musculoskeletal:     ?   General: Normal range of motion.  ?   Cervical back: Normal range of motion and neck supple.  ?Lymphadenopathy:  ?   Cervical: No cervical adenopathy.  ?Skin: ?   General: Skin is warm and dry.  ?   Findings: No rash.  ?Neurological:  ?   Mental Status: She is alert and oriented to person, place, and time.  ? ? ?ED Results / Procedures / Treatments   ?Labs ?(all labs ordered are listed, but only abnormal results are displayed) ?Labs Reviewed  ?COMPREHENSIVE METABOLIC PANEL - Abnormal; Notable for the following components:  ?    Result Value  ? Sodium 133 (*)   ? Chloride 93 (*)   ? Glucose, Bld 186 (*)   ? GFR, Estimated 57 (*)   ? All other components within normal limits  ?URINALYSIS, ROUTINE W REFLEX MICROSCOPIC - Abnormal; Notable for the following components:  ? Protein, ur 30 (*)   ? All other components within normal limits  ?LIPASE, BLOOD  ?CBC  ? ? ?EKG ?None ? ?Radiology ?CT Abdomen Pelvis W Contrast ? ?Result Date: 04/13/2022 ?CLINICAL DATA:  Left lower quadrant abdominal pain. EXAM: CT ABDOMEN AND PELVIS WITH CONTRAST TECHNIQUE: Multidetector CT imaging of the abdomen and pelvis was performed using the standard protocol following bolus administration of intravenous  contrast. RADIATION DOSE REDUCTION: This exam was performed according to the departmental dose-optimization program which includes automated exposure control, adjustment of the mA and/or kV according to patient size and/or use of iterative reconstruction technique. CONTRAST:  155m OMNIPAQUE IOHEXOL 300 MG/ML  SOLN COMPARISON:  No comparison studies available. FINDINGS: Lower chest: Unremarkable. Hepatobiliary: No suspicious focal abnormality within the liver parenchyma. There is no evidence for gallstones, gallbladder wall thickening, or pericholecystic fluid. No intrahepatic or extrahepatic biliary dilation. Pancreas: No focal mass lesion. No dilatation of the main duct. No intraparenchymal cyst. No peripancreatic edema. Spleen: No splenomegaly. No focal mass lesion.  Adrenals/Urinary Tract: No adrenal nodule or mass. Right kidney unremarkable. 14 mm lesion lower pole left kidney (23/2) shows features suspicious for diffuse low level enhancement. No evidence for hydroureter. The urinary bladder appears normal for the degree of distention. Stomach/Bowel: Tiny hiatal hernia. Stomach otherwise unremarkable. Duodenum is normally positioned as is the ligament of Treitz. No small bowel wall thickening. No small bowel dilatation. The terminal ileum is normal. The appendix is not well visualized, but there is no edema or inflammation in the region of the cecum. No gross colonic mass. No colonic wall thickening. Moderate to large stool volume in the right colon with moderate stool volume in the transverse colon and left colon largely free of stool. Vascular/Lymphatic: There is mild atherosclerotic calcification of the abdominal aorta without aneurysm. There is no gastrohepatic or hepatoduodenal ligament lymphadenopathy. No retroperitoneal or mesenteric lymphadenopathy. No pelvic sidewall lymphadenopathy. Reproductive: Calcified fibroids are seen in the uterus There is no adnexal mass. Other: No intraperitoneal free fluid.  Musculoskeletal: No worrisome lytic or sclerotic osseous abnormality. Mild compression deformity noted at L2 with diffuse degenerative disc disease in the lumbar spine. IMPRESSION: 1. 14 mm lesion lower pole lef

## 2022-04-13 NOTE — ED Notes (Signed)
Patient transported to CT 

## 2022-06-08 ENCOUNTER — Other Ambulatory Visit: Payer: Self-pay | Admitting: Family Medicine

## 2022-10-18 IMAGING — CR DG CHEST 2V
2 series · 2 of 2 positions shown · non-contrast
Comparison: January 27, 2019

CLINICAL DATA: Chest pain.

EXAM:
CHEST - 2 VIEW

[chest pa]
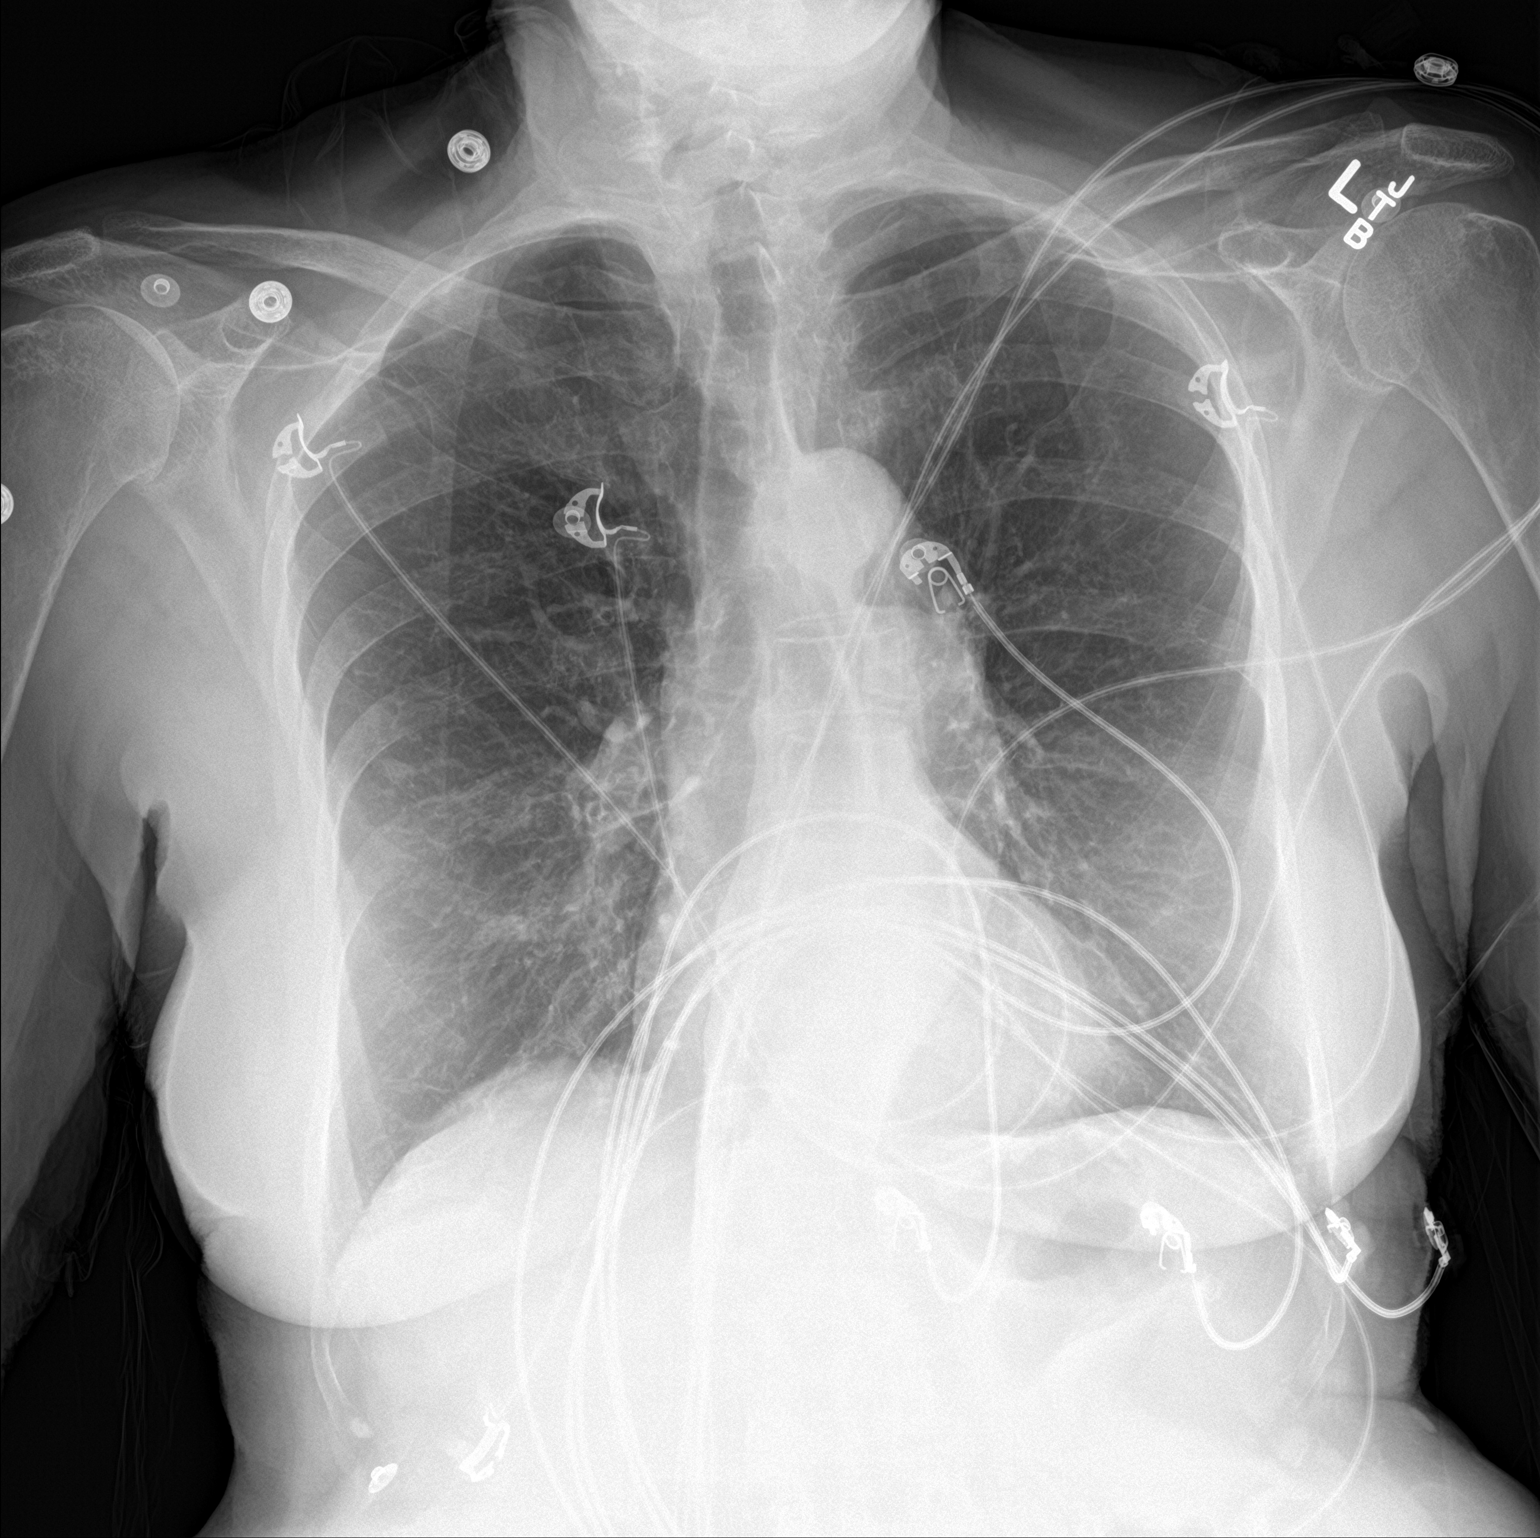

[chest lat]
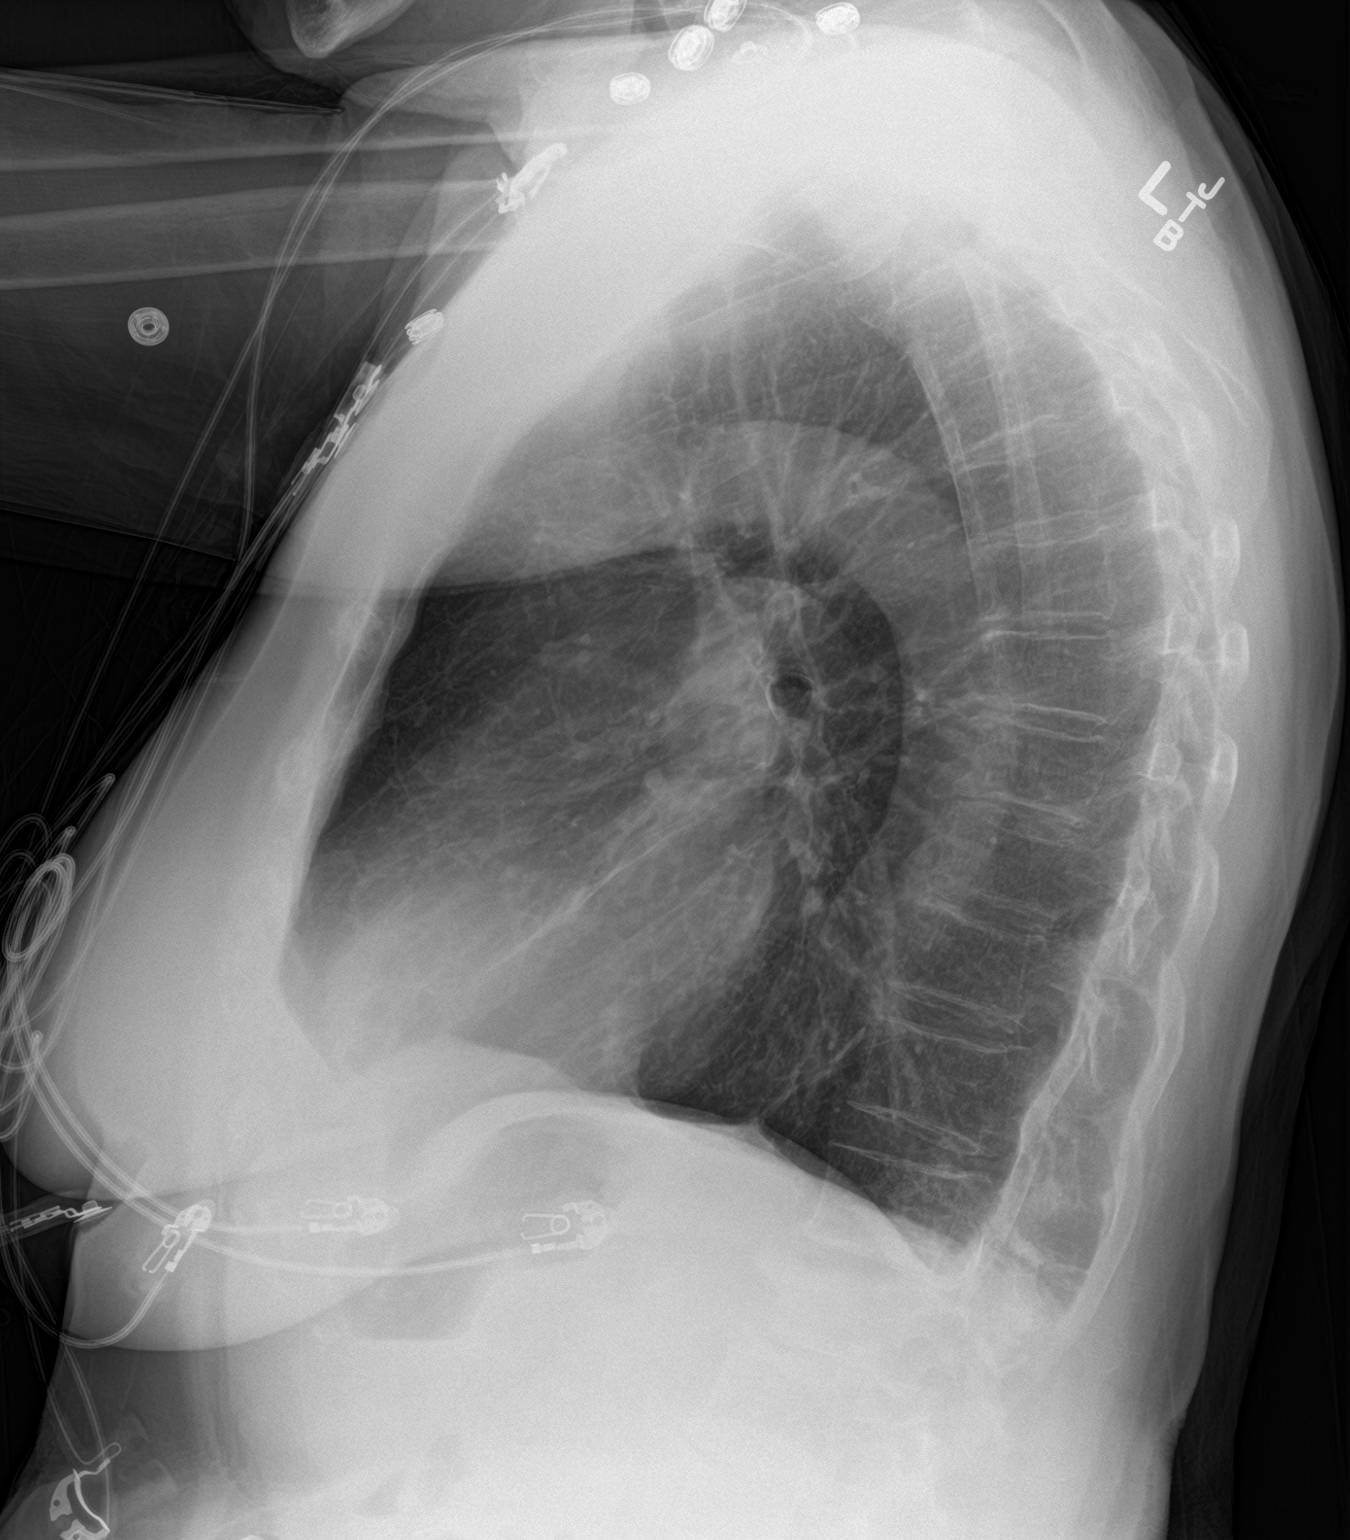

[2 of 2 positions shown; findings below may reference images not displayed]

FINDINGS: The heart size and mediastinal contours are within normal limits. No
consolidation. Approximately 6 mm nodular opacity in the lateral
right midlung appears similar to the prior. No visible pleural
effusions or pneumothorax. Similar height loss of a lumbar vertebral
body. No acute osseous abnormality. Osteopenia.
IMPRESSION: 1. No evidence of acute cardiopulmonary disease.
2. Approximately 6 mm nodular opacity in the lateral right midlung
appears similar to the prior from 8888.

## 2023-09-28 IMAGING — CT CT ABD-PELV W/ CM
2 of 5 series · 16 of 46 positions shown, 18 images · IV contrast (APPLIED)
Comparison: No comparison studies available.

CLINICAL DATA: Left lower quadrant abdominal pain.

EXAM:
CT ABDOMEN AND PELVIS WITH CONTRAST
TECHNIQUE: Multidetector CT imaging of the abdomen and pelvis was performed
using the standard protocol following bolus administration of
intravenous contrast.

[Series 2: abd pel w · axial · 0.80mm/px · z∈[+871,+1231]mm · 13 of 80 slices shown, 15 images]
[im 4/80  soft-tissue]
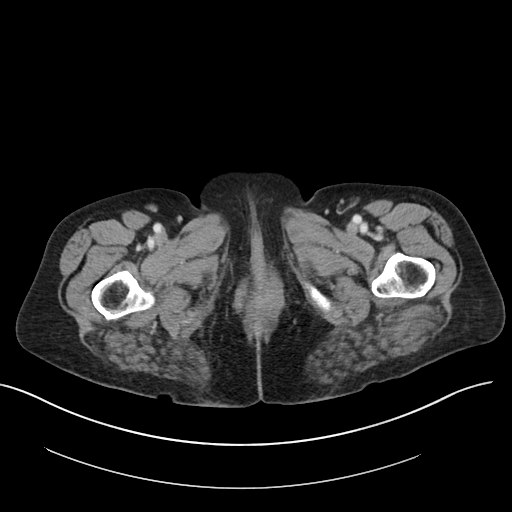
[im 4/80  bone]
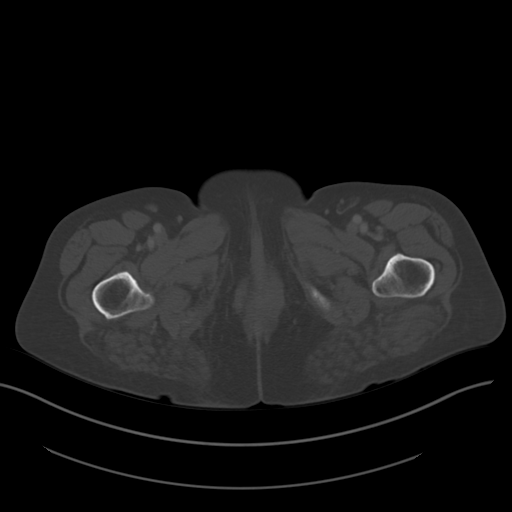
[im 12/80  soft-tissue]
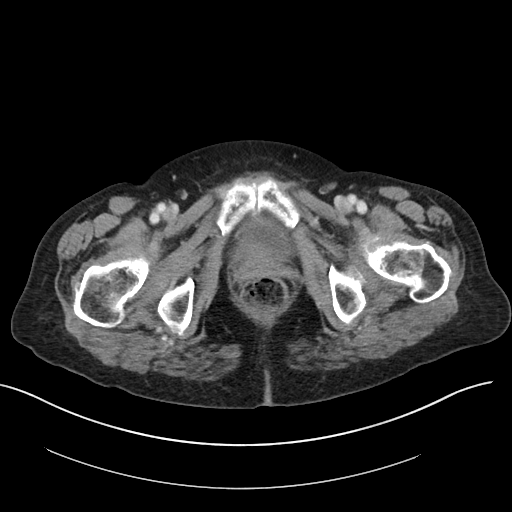
[im 16/80  soft-tissue]
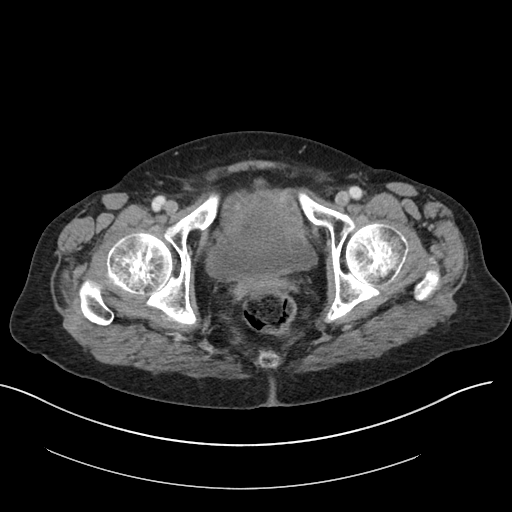
[im 24/80  soft-tissue]
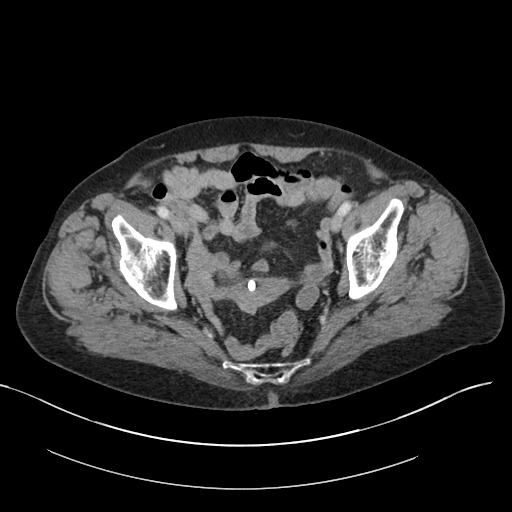
[im 28/80  soft-tissue]
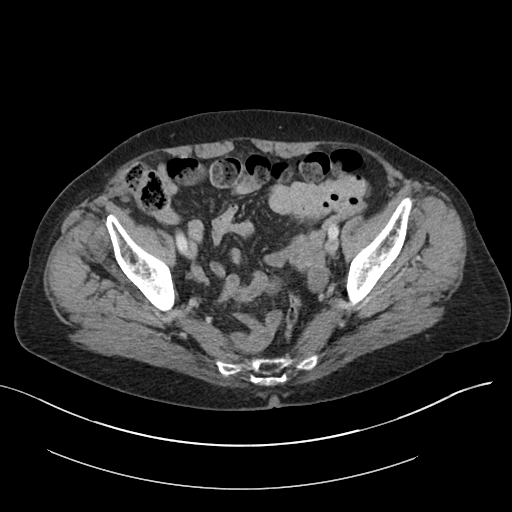
[im 36/80  soft-tissue]
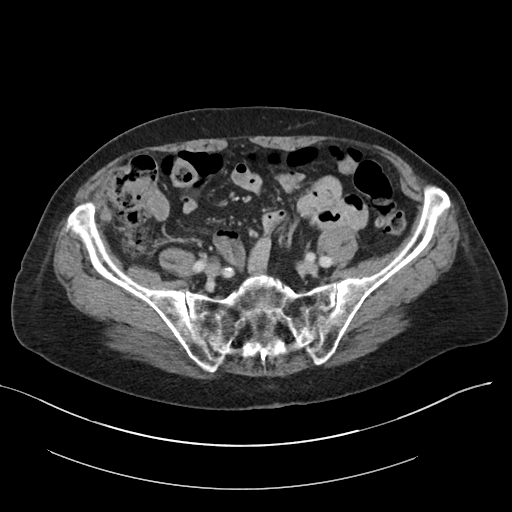
[im 40/80  soft-tissue]
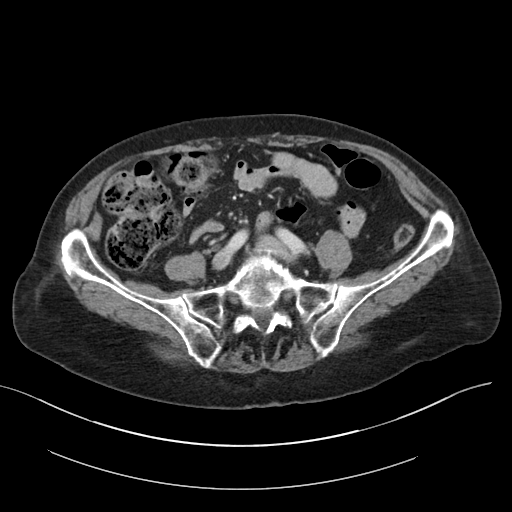
[im 44/80  soft-tissue]
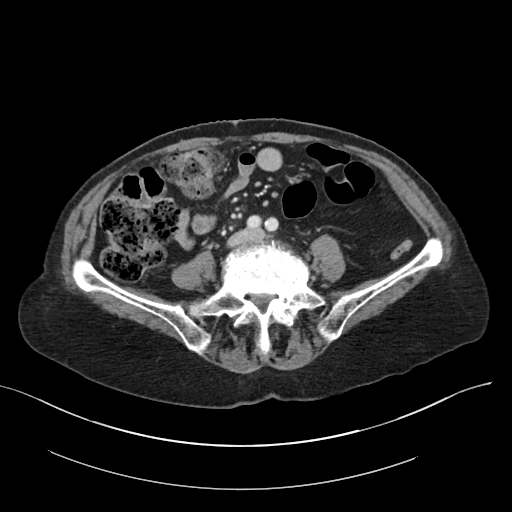
[im 52/80  soft-tissue]
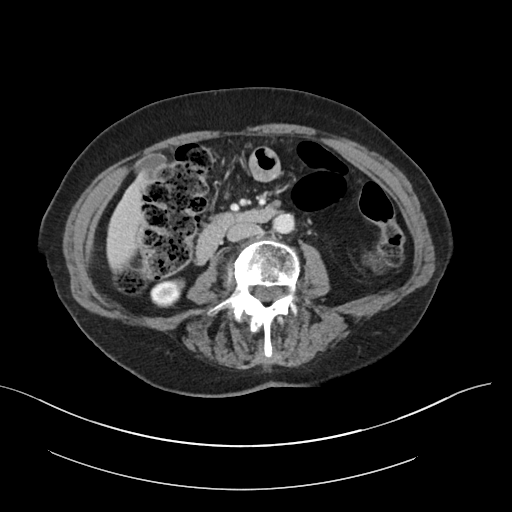
[im 52/80  bone]
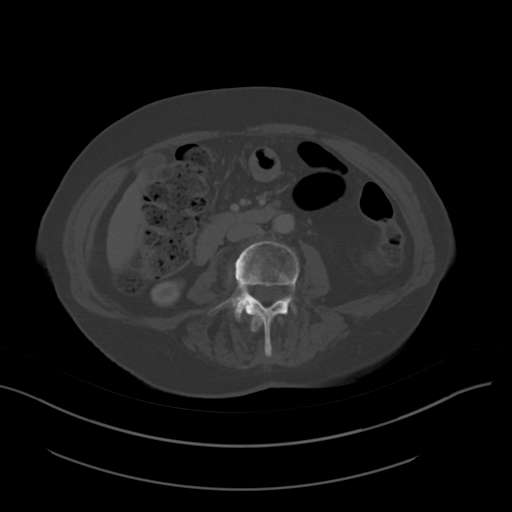
[im 56/80  soft-tissue]
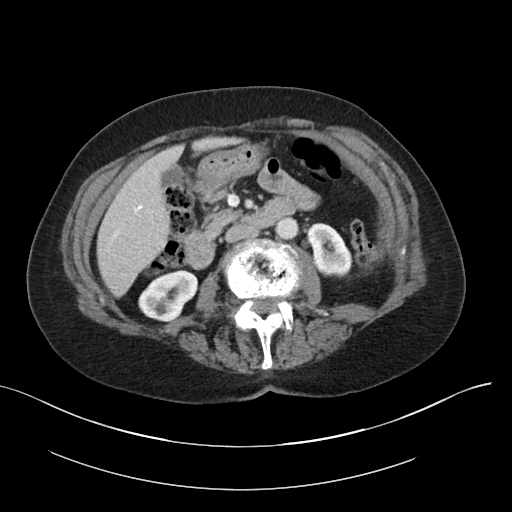
[im 64/80  soft-tissue]
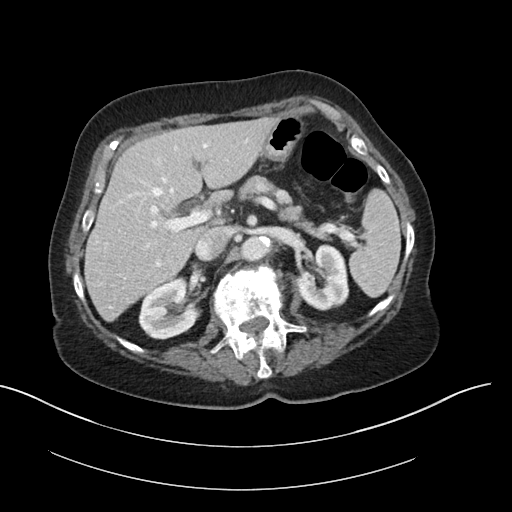
[im 68/80  soft-tissue]
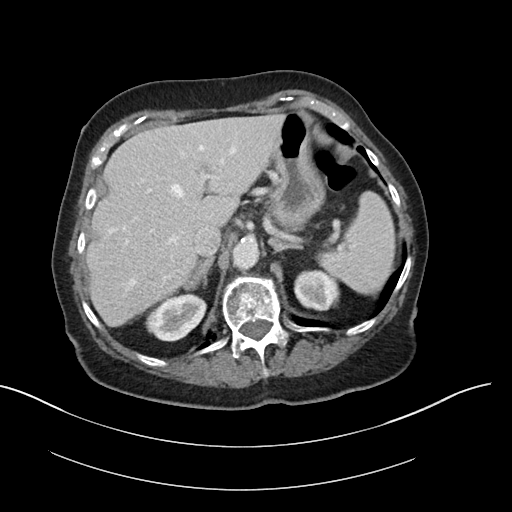
[im 76/80  soft-tissue]
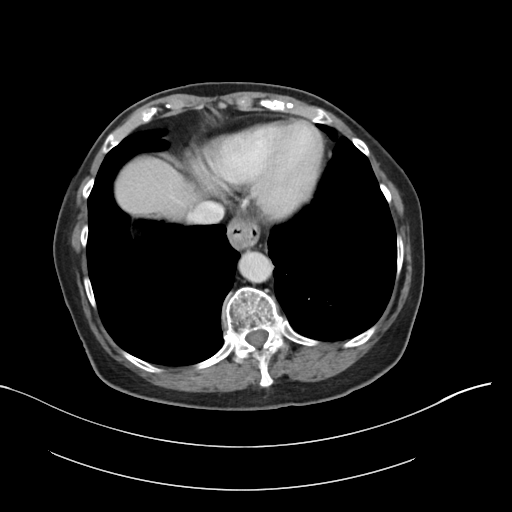

[Series 5: coronal · coronal · 0.76mm/px · 3 of 84 slices shown]
[im 28/84  soft-tissue]
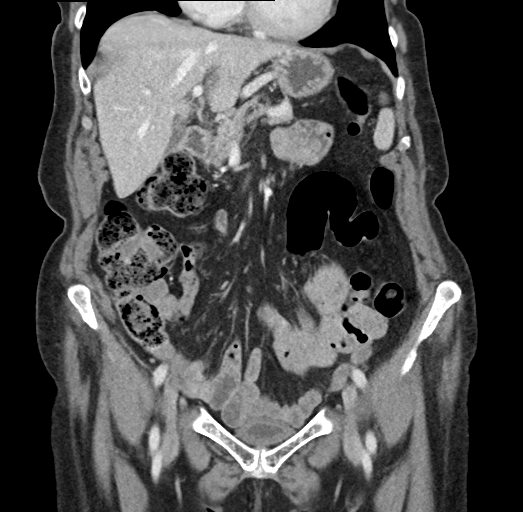
[im 37/84  soft-tissue]
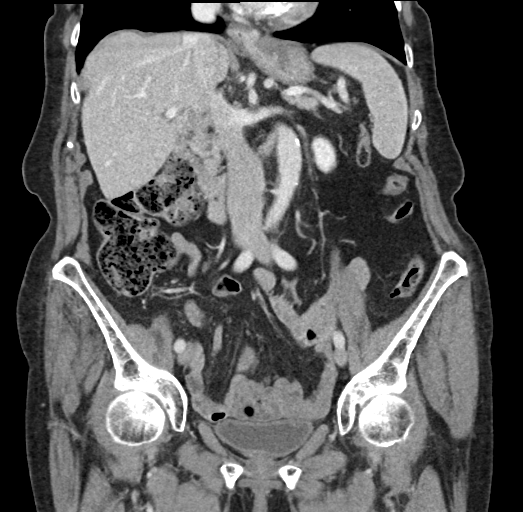
[im 47/84  soft-tissue]
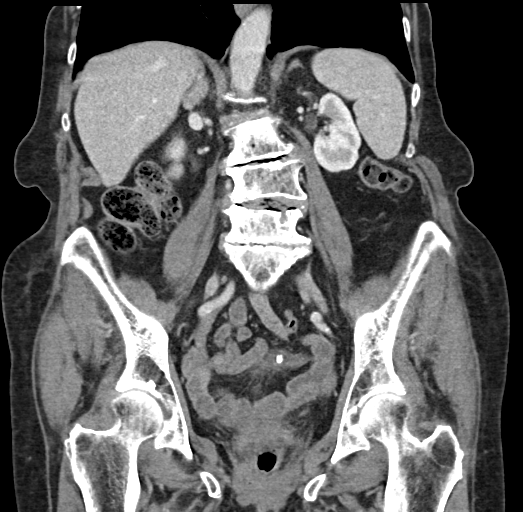

[16 of 46 positions shown; findings below may reference images not displayed]

RADIATION DOSE REDUCTION: This exam was performed according to the
departmental dose-optimization program which includes automated
exposure control, adjustment of the mA and/or kV according to
patient size and/or use of iterative reconstruction technique.

CONTRAST:  100mL OMNIPAQUE IOHEXOL 300 MG/ML  SOLN
FINDINGS: Lower chest: Unremarkable.

Hepatobiliary: No suspicious focal abnormality within the liver
parenchyma. There is no evidence for gallstones, gallbladder wall
thickening, or pericholecystic fluid. No intrahepatic or
extrahepatic biliary dilation.

Pancreas: No focal mass lesion. No dilatation of the main duct. No
intraparenchymal cyst. No peripancreatic edema.

Spleen: No splenomegaly. No focal mass lesion.

Adrenals/Urinary Tract: No adrenal nodule or mass. Right kidney
unremarkable. 14 mm lesion lower pole left kidney ([DATE]) shows
features suspicious for diffuse low level enhancement. No evidence
for hydroureter. The urinary bladder appears normal for the degree
of distention.

Stomach/Bowel: Tiny hiatal hernia. Stomach otherwise unremarkable.
Duodenum is normally positioned as is the ligament of Treitz. No
small bowel wall thickening. No small bowel dilatation. The terminal
ileum is normal. The appendix is not well visualized, but there is
no edema or inflammation in the region of the cecum. No gross
colonic mass. No colonic wall thickening. Moderate to large stool
volume in the right colon with moderate stool volume in the
transverse colon and left colon largely free of stool.

Vascular/Lymphatic: There is mild atherosclerotic calcification of
the abdominal aorta without aneurysm. There is no gastrohepatic or
hepatoduodenal ligament lymphadenopathy. No retroperitoneal or
mesenteric lymphadenopathy. No pelvic sidewall lymphadenopathy.

Reproductive: Calcified fibroids are seen in the uterus There is no
adnexal mass.

Other: No intraperitoneal free fluid.

Musculoskeletal: No worrisome lytic or sclerotic osseous
abnormality. Mild compression deformity noted at L2 with diffuse
degenerative disc disease in the lumbar spine.
IMPRESSION: 1. 14 mm lesion lower pole left kidney shows features suspicious for
diffuse low level enhancement. As neoplasm is a concern, MRI of the
abdomen with and without contrast recommended to further evaluate.
2. Moderate to large stool volume in the right colon with moderate
stool volume in the transverse colon. Left colon largely free of
stool.
3. No substantial diverticular disease in the colon and no evidence
for diverticulitis.
4. Tiny hiatal hernia.
5. Aortic Atherosclerosis ([id number]).

## 2023-11-23 ENCOUNTER — Other Ambulatory Visit: Payer: Self-pay | Admitting: Urology

## 2023-11-23 DIAGNOSIS — D49512 Neoplasm of unspecified behavior of left kidney: Secondary | ICD-10-CM

## 2024-03-07 ENCOUNTER — Ambulatory Visit
Admission: RE | Admit: 2024-03-07 | Discharge: 2024-03-07 | Disposition: A | Discharge status: Home / Self Care | Source: Ambulatory Visit | Attending: Urology | Admitting: Urology

## 2024-03-07 DIAGNOSIS — D49512 Neoplasm of unspecified behavior of left kidney: Secondary | ICD-10-CM

## 2024-03-07 MED ORDER — GADOPICLENOL 0.5 MMOL/ML IV SOLN
7.0000 mL | Freq: Once | INTRAVENOUS | Status: AC | PRN
  Administered 2024-03-07: 7 mL via INTRAVENOUS

## 2024-03-29 ENCOUNTER — Other Ambulatory Visit: Payer: Self-pay

## 2024-03-29 ENCOUNTER — Encounter (HOSPITAL_BASED_OUTPATIENT_CLINIC_OR_DEPARTMENT_OTHER): Payer: Self-pay | Admitting: Emergency Medicine

## 2024-03-29 ENCOUNTER — Emergency Department (HOSPITAL_BASED_OUTPATIENT_CLINIC_OR_DEPARTMENT_OTHER)
Admission: EM | Admit: 2024-03-29 | Discharge: 2024-03-29 | Disposition: A | Discharge status: Home / Self Care | Attending: Emergency Medicine | Admitting: Emergency Medicine

## 2024-03-29 DIAGNOSIS — I10 Essential (primary) hypertension: Secondary | ICD-10-CM | POA: Insufficient documentation

## 2024-03-29 DIAGNOSIS — M545 Low back pain, unspecified: Secondary | ICD-10-CM | POA: Diagnosis not present

## 2024-03-29 DIAGNOSIS — Z85528 Personal history of other malignant neoplasm of kidney: Secondary | ICD-10-CM | POA: Insufficient documentation

## 2024-03-29 DIAGNOSIS — Z7982 Long term (current) use of aspirin: Secondary | ICD-10-CM | POA: Insufficient documentation

## 2024-03-29 DIAGNOSIS — Z85850 Personal history of malignant neoplasm of thyroid: Secondary | ICD-10-CM | POA: Diagnosis not present

## 2024-03-29 DIAGNOSIS — Z79899 Other long term (current) drug therapy: Secondary | ICD-10-CM | POA: Insufficient documentation

## 2024-03-29 DIAGNOSIS — K5900 Constipation, unspecified: Secondary | ICD-10-CM | POA: Insufficient documentation

## 2024-03-29 DIAGNOSIS — K6289 Other specified diseases of anus and rectum: Secondary | ICD-10-CM | POA: Diagnosis present

## 2024-03-29 LAB — URINALYSIS, ROUTINE W REFLEX MICROSCOPIC
Bilirubin Urine: NEGATIVE
Glucose, UA: NEGATIVE mg/dL
Hgb urine dipstick: NEGATIVE
Ketones, ur: NEGATIVE mg/dL
Leukocytes,Ua: NEGATIVE
Nitrite: NEGATIVE
Specific Gravity, Urine: 1.005 (ref 1.005–1.030)
pH: 7 (ref 5.0–8.0)

## 2024-03-29 LAB — COMPREHENSIVE METABOLIC PANEL WITH GFR
ALT: 12 U/L (ref 0–44)
AST: 23 U/L (ref 15–41)
Albumin: 5.2 g/dL — ABNORMAL HIGH (ref 3.5–5.0)
Alkaline Phosphatase: 93 U/L (ref 38–126)
Anion gap: 11 (ref 5–15)
BUN: 14 mg/dL (ref 8–23)
CO2: 30 mmol/L (ref 22–32)
Calcium: 10.3 mg/dL (ref 8.9–10.3)
Chloride: 95 mmol/L — ABNORMAL LOW (ref 98–111)
Creatinine, Ser: 1.12 mg/dL — ABNORMAL HIGH (ref 0.44–1.00)
GFR, Estimated: 46 mL/min — ABNORMAL LOW (ref >60)
Glucose, Bld: 97 mg/dL (ref 70–99)
Potassium: 4.3 mmol/L (ref 3.5–5.1)
Sodium: 136 mmol/L (ref 135–145)
Total Bilirubin: 0.6 mg/dL (ref 0.0–1.2)
Total Protein: 8.1 g/dL (ref 6.5–8.1)

## 2024-03-29 LAB — CBC WITH DIFFERENTIAL/PLATELET
Abs Immature Granulocytes: 0.01 10*3/uL (ref 0.00–0.07)
Basophils Absolute: 0.1 10*3/uL (ref 0.0–0.1)
Basophils Relative: 1 %
Eosinophils Absolute: 0.1 10*3/uL (ref 0.0–0.5)
Eosinophils Relative: 3 %
HCT: 43.7 % (ref 36.0–46.0)
Hemoglobin: 14.5 g/dL (ref 12.0–15.0)
Immature Granulocytes: 0 %
Lymphocytes Relative: 19 %
Lymphs Abs: 0.9 10*3/uL (ref 0.7–4.0)
MCH: 27.5 pg (ref 26.0–34.0)
MCHC: 33.2 g/dL (ref 30.0–36.0)
MCV: 82.9 fL (ref 80.0–100.0)
Monocytes Absolute: 0.5 10*3/uL (ref 0.1–1.0)
Monocytes Relative: 10 %
Neutro Abs: 3.2 10*3/uL (ref 1.7–7.7)
Neutrophils Relative %: 67 %
Platelets: 203 10*3/uL (ref 150–400)
RBC: 5.27 MIL/uL — ABNORMAL HIGH (ref 3.87–5.11)
RDW: 13.9 % (ref 11.5–15.5)
WBC: 4.7 10*3/uL (ref 4.0–10.5)
nRBC: 0 % (ref 0.0–0.2)

## 2024-03-29 MED ORDER — POLYETHYLENE GLYCOL 3350 17 G PO PACK
17.0000 g | PACK | Freq: Every day | ORAL | Status: DC
  Administered 2024-03-29: 17 g via ORAL
  Filled 2024-03-29: qty 1

## 2024-03-29 MED ORDER — FLEET ENEMA RE ENEM
1.0000 | ENEMA | Freq: Once | RECTAL | Status: DC
  Filled 2024-03-29: qty 1

## 2024-03-29 NOTE — ED Notes (Signed)
 Fleet given

## 2024-03-29 NOTE — ED Triage Notes (Signed)
 C/o lower back pain x 2 week. States pain has gotten worse. Denies any known injuries. Had recently MRI done at the beginning on April.

## 2024-03-29 NOTE — ED Provider Notes (Signed)
 [place] EMERGENCY DEPARTMENT AT [facility] Provider Note   CSN: [id number] Arrival date & time: 03/29/24  1202     History  Chief Complaint  Patient presents with   Back Pain    Tammy Harding is a 89 y.o. female.  Patient to ED with pain in and around the rectum and low back. She reports feeling like she has to have a bowel movement but cannot pass anything other than small, hard balls. She has a history of renal CA with recent abdominal MRI showing constipation (4/15) and reports no significant or large volume stool since that time despite use of Dulcolax and Miralax . No vomiting, fever.   The history is provided by the patient and a relative. No language interpreter was used.  Back Pain      Home Medications Prior to Admission medications   Medication Sig Start Date End Date Taking? Authorizing Provider  diltiazem (CARDIZEM CD) 120 MG 24 hr capsule Take 120 mg by mouth 2 (two) times daily. 03/22/24  Yes [provider]  pantoprazole  (PROTONIX ) 20 MG tablet Take 20 mg by mouth daily. 02/08/24  Yes [provider]  albuterol  (VENTOLIN  HFA) 108 (90 Base) MCG/ACT inhaler INHALE 2 PUFFS INTO THE LUNGS EVERY 4 (FOUR) HOURS AS NEEDED FOR WHEEZING OR SHORTNESS OF BREATH. 12/04/20   Luevenia Saha, MD  ALPRAZolam  (XANAX ) 0.5 MG tablet TAKE 1/2 TABLET BY MOUTH AT BEDTIME AS NEEDED FOR ANXIETY 04/03/20   Luevenia Saha, MD  aspirin 81 MG chewable tablet Chew 81 mg by mouth daily.    [provider]  carvedilol  (COREG ) 12.5 MG tablet TAKE 1 TABLET BY MOUTH 2 TIMES DAILY. 03/07/21   Almira Jaeger, MD  cholecalciferol (VITAMIN D3) 25 MCG (1000 UT) tablet Take 1,000 Units by mouth daily.    [provider]  cloNIDine (CATAPRES) 0.1 MG tablet TAKE 1 TABLET BY MOUTH THREE TIMES A DAY 11/01/21   Almira Jaeger, MD  hydrALAZINE  (APRESOLINE ) 25 MG tablet Take 1 tablet (25 mg total) by mouth 3 (three) times daily. 03/12/20   Luevenia Saha, MD   Hyoscyamine  Sulfate SL (LEVSIN/SL) 0.125 MG SUBL Place 0.125 mg under the tongue 3 (three) times daily with meals as needed (abdominal cramping or bloating). 10/18/19   Luevenia Saha, MD  levothyroxine  (SYNTHROID ) 137 MCG tablet Take 1 tablet (137 mcg total) by mouth daily. PLEASE SCHEDULE FOLLOW UP WITH DR. Jonelle Neri FOR FURTHER REFILLS [id number] 09/09/20   Luevenia Saha, MD  lidocaine  (LIDODERM ) 5 % Place 1 patch onto the skin daily. Remove & Discard patch within 12 hours or as directed by MD 05/03/21   Darlis Eisenmenger, PA-C  oxyCODONE  (ROXICODONE ) 5 MG immediate release tablet Take 1 tablet (5 mg total) by mouth every 4 (four) hours as needed for severe pain. 05/03/21   Darlis Eisenmenger, PA-C  PARoxetine  (PAXIL ) 10 MG tablet TAKE 1 TABLET BY MOUTH EVERY DAY 02/15/20   Luevenia Saha, MD  polyethylene glycol (MIRALAX  / GLYCOLAX ) 17 g packet Take 17 g by mouth daily. 04/13/22   Hershel Los, MD      Allergies    Penicillins, Amlodipine, Bee venom, Hctz [hydrochlorothiazide], Horse epithelium, Lisinopril, and Losartan    Review of Systems   Review of Systems  Musculoskeletal:  Positive for back pain.    Physical Exam Updated Vital Signs BP (!) 197/106   Pulse 67   Temp 98.4 F (36.9 C) (Oral)   Resp 18  SpO2 97%  Physical Exam Vitals and nursing note reviewed.  Constitutional:      General: She is not in acute distress.    Appearance: She is well-developed. She is not ill-appearing.  Pulmonary:     Effort: Pulmonary effort is normal.  Abdominal:     General: There is no distension.     Palpations: Abdomen is soft.     Tenderness: There is no abdominal tenderness.  Genitourinary:    Comments: Rectal exam shows clear rectum without fecal impaction. Musculoskeletal:        General: Normal range of motion.     Cervical back: Normal range of motion.  Skin:    General: Skin is warm and dry.  Neurological:     Mental Status: She is alert and oriented to person, place, and time.      ED Results / Procedures / Treatments   Labs (all labs ordered are listed, but only abnormal results are displayed) Labs Reviewed  CBC WITH DIFFERENTIAL/PLATELET - Abnormal; Notable for the following components:      Result Value   RBC 5.27 (*)    All other components within normal limits  COMPREHENSIVE METABOLIC PANEL WITH GFR - Abnormal; Notable for the following components:   Chloride 95 (*)    Creatinine, Ser 1.12 (*)    Albumin 5.2 (*)    GFR, Estimated 46 (*)    All other components within normal limits  URINALYSIS, ROUTINE W REFLEX MICROSCOPIC - Abnormal; Notable for the following components:   Color, Urine COLORLESS (*)    Protein, ur TRACE (*)    All other components within normal limits   Results for orders placed or performed during the hospital encounter of 03/29/24  CBC with Differential   Collection Time: 03/29/24  1:10 PM  Result Value Ref Range   WBC 4.7 4.0 - 10.5 K/uL   RBC 5.27 (H) 3.87 - 5.11 MIL/uL   Hemoglobin 14.5 12.0 - 15.0 g/dL   HCT 16.1 09.6 - 04.5 %   MCV 82.9 80.0 - 100.0 fL   MCH 27.5 26.0 - 34.0 pg   MCHC 33.2 30.0 - 36.0 g/dL   RDW 40.9 81.1 - 91.4 %   Platelets 203 150 - 400 K/uL   nRBC 0.0 0.0 - 0.2 %   Neutrophils Relative % 67 %   Neutro Abs 3.2 1.7 - 7.7 K/uL   Lymphocytes Relative 19 %   Lymphs Abs 0.9 0.7 - 4.0 K/uL   Monocytes Relative 10 %   Monocytes Absolute 0.5 0.1 - 1.0 K/uL   Eosinophils Relative 3 %   Eosinophils Absolute 0.1 0.0 - 0.5 K/uL   Basophils Relative 1 %   Basophils Absolute 0.1 0.0 - 0.1 K/uL   Immature Granulocytes 0 %   Abs Immature Granulocytes 0.01 0.00 - 0.07 K/uL  Comprehensive metabolic panel   Collection Time: 03/29/24  1:10 PM  Result Value Ref Range   Sodium 136 135 - 145 mmol/L   Potassium 4.3 3.5 - 5.1 mmol/L   Chloride 95 (L) 98 - 111 mmol/L   CO2 30 22 - 32 mmol/L   Glucose, Bld 97 70 - 99 mg/dL   BUN 14 8 - 23 mg/dL   Creatinine, Ser 7.82 (H) 0.44 - 1.00 mg/dL   Calcium 95.6 8.9 -  21.3 mg/dL   Total Protein 8.1 6.5 - 8.1 g/dL   Albumin 5.2 (H) 3.5 - 5.0 g/dL   AST 23 15 - 41 U/L  ALT 12 0 - 44 U/L   Alkaline Phosphatase 93 38 - 126 U/L   Total Bilirubin 0.6 0.0 - 1.2 mg/dL   GFR, Estimated 46 (L) >60 mL/min   Anion gap 11 5 - 15  Urinalysis, Routine w reflex microscopic -Urine, Clean Catch   Collection Time: 03/29/24  1:10 PM  Result Value Ref Range   Color, Urine COLORLESS (A) YELLOW   APPearance CLEAR CLEAR   Specific Gravity, Urine 1.005 1.005 - 1.030   pH 7.0 5.0 - 8.0   Glucose, UA NEGATIVE NEGATIVE mg/dL   Hgb urine dipstick NEGATIVE NEGATIVE   Bilirubin Urine NEGATIVE NEGATIVE   Ketones, ur NEGATIVE NEGATIVE mg/dL   Protein, ur TRACE (A) NEGATIVE mg/dL   Nitrite NEGATIVE NEGATIVE   Leukocytes,Ua NEGATIVE NEGATIVE    EKG None  Radiology No results found.  Procedures Procedures    Medications Ordered in ED Medications  polyethylene glycol (MIRALAX  / GLYCOLAX ) packet 17 g (17 g Oral Given 03/29/24 1341)  sodium phosphate (FLEET) enema 1 enema (has no administration in time range)    ED Course/ Medical Decision Making/ A&P                                 Medical Decision Making This patient presents to the ED for concern of rectal pain, this involves an extensive number of treatment options, and is a complaint that carries with it a high risk of complications and morbidity.  The differential diagnosis includes infection/abscess of rectum, infection/abscess or perianal area, pilonidal abscess, MSK back pain, fecal impaction   Co morbidities that complicate the patient evaluation  Arthritis, thyroid  cancer, anxiety, allergy, HTN, A-fib not anticoagulated, renal cancer   Additional history obtained:  Additional history and/or information obtained from chart review, notable for MRI 4/15 showing constipation, renal cancer (known)   Lab Tests:  I Ordered, and personally interpreted labs.  The pertinent results include:  UA neg; WBC  5.1, normal hgb; Cr 1.12    Imaging Studies ordered:  I ordered imaging studies including n/a I independently visualized and interpreted imaging which showed n/a I agree with the radiologist interpretation   Cardiac Monitoring:  The patient was maintained on a cardiac monitor.  I personally viewed and interpreted the cardiac monitored which showed an underlying rhythm of: na/   Medicines ordered and prescription drug management:  I ordered medication including Fleet enema  for constipation Reevaluation of the patient after these medicines showed that the patient improved I have reviewed the patients home medicines and have made adjustments as needed   Test Considered:  Imaging of abdomen prior to laxative use - MRI reviewed 4/15 and is considered satisfactory. No leukocytosis, no fever, abdomen benign. Doubt obstruction   Critical Interventions:  N/a   Consultations Obtained:  I requested consultation with the n/a,  and discussed lab and imaging findings as well as pertinent plan - they recommend: n/a   Problem List / ED Course:  Here with rectal and low back pain History reported is consistent with constipation Recent MRI showing constipation Exam for fecal impaction negative.   Fleets enema given with moderate results.  Discussed home treatment, specifically, Miralax  up to 3 times daily for 2-3 days in a row (no more), continue stool softeners. Follow up with PCP closely.  Return precautions discussed.    Reevaluation:  After the interventions noted above, I reevaluated the patient and found that they have :  improved   Social Determinants of Health:  Has good family support   Disposition:  After consideration of the diagnostic results and the patients response to treatment, I feel that the patient would benefit from discharge home.   Amount and/or Complexity of Data Reviewed Labs: ordered.  Risk OTC drugs.           Final Clinical  Impression(s) / ED Diagnoses Final diagnoses:  Constipation, unspecified constipation type    Rx / DC Orders ED Discharge Orders     None         Mandy Second, PA-C 03/29/24 1433    Nicklas Barns, MD 03/29/24 1512

## 2024-03-29 NOTE — Discharge Instructions (Signed)
 As we discussed, continue use of your stool softeners. You can use Miralax  3 times daily for no more than 2-3 days consecutively. This is totally safe. You can use enemas at home if having further constipation after increasing your Miralax  dosing. Plan to follow up with your doctor if enemas are necessary.   Return to the ED with any severe abdominal or rectal pain, fever, vomiting, blood from the rectum, or for new concern.
